# Patient Record
Sex: Female | Born: 1980 | Race: White | Hispanic: No | Marital: Married | Smoking: Never smoker
Health system: Southern US, Community
[De-identification: ages and names within clinical notes are randomized; demographics above are authoritative.]

## PROBLEM LIST (undated history)

## (undated) DIAGNOSIS — F419 Anxiety disorder, unspecified: Secondary | ICD-10-CM

## (undated) DIAGNOSIS — R519 Headache, unspecified: Secondary | ICD-10-CM

## (undated) DIAGNOSIS — D649 Anemia, unspecified: Secondary | ICD-10-CM

## (undated) DIAGNOSIS — T7840XA Allergy, unspecified, initial encounter: Secondary | ICD-10-CM

## (undated) DIAGNOSIS — R51 Headache: Secondary | ICD-10-CM

## (undated) DIAGNOSIS — G43909 Migraine, unspecified, not intractable, without status migrainosus: Secondary | ICD-10-CM

## (undated) HISTORY — DX: Migraine, unspecified, not intractable, without status migrainosus: G43.909

## (undated) HISTORY — DX: Headache, unspecified: R51.9

## (undated) HISTORY — DX: Allergy, unspecified, initial encounter: T78.40XA

## (undated) HISTORY — DX: Headache: R51

## (undated) HISTORY — DX: Anxiety disorder, unspecified: F41.9

## (undated) HISTORY — DX: Anemia, unspecified: D64.9

---

## 2014-12-09 ENCOUNTER — Encounter (INDEPENDENT_AMBULATORY_CARE_PROVIDER_SITE_OTHER): Payer: Self-pay

## 2014-12-09 ENCOUNTER — Encounter: Payer: Self-pay | Admitting: Nurse Practitioner

## 2014-12-09 ENCOUNTER — Ambulatory Visit (INDEPENDENT_AMBULATORY_CARE_PROVIDER_SITE_OTHER): Payer: 59 | Admitting: Nurse Practitioner

## 2014-12-09 VITALS — BP 138/82 | HR 96 | Temp 98.5°F | Resp 16 | Ht 65.0 in | Wt 196.2 lb

## 2014-12-09 DIAGNOSIS — Z7189 Other specified counseling: Secondary | ICD-10-CM | POA: Diagnosis not present

## 2014-12-09 DIAGNOSIS — R1013 Epigastric pain: Secondary | ICD-10-CM

## 2014-12-09 DIAGNOSIS — Z7689 Persons encountering health services in other specified circumstances: Secondary | ICD-10-CM

## 2014-12-09 NOTE — Patient Instructions (Signed)
Indigestion °Indigestion is discomfort in the upper abdomen that is caused by underlying problems such as gastroesophageal reflux disease (GERD), ulcers, or gallbladder problems.  °CAUSES  °Indigestion can be caused by many things. Possible causes include: °· Stomach acid in the esophagus. °· Stomach infections, usually caused by the bacteria H. pylori. °· Being overweight. °· Hiatal hernia. This means part of the stomach pushes up through the diaphragm. °· Overeating. °· Emotional problems, such as stress, anxiety, or depression. °· Poor nutrition. °· Consuming too much alcohol, tobacco, or caffeine. °· Consuming spicy foods, fats, peppermint, chocolate, tomato products, citrus, or fruit juices. °· Medicines such as aspirin and other anti-inflammatory drugs, hormones, steroids, and thyroid medicines. °· Gastroparesis. This is a condition in which the stomach does not empty properly. °· Stomach cancer. °· Pregnancy, due to an increase in hormone levels, a relaxation of muscles in the digestive tract, and pressure on the stomach from the growing fetus. °SYMPTOMS  °· Uncomfortable feeling of fullness after eating. °· Pain or burning sensation in the upper abdomen. °· Bloating. °· Belching and gas. °· Nausea and vomiting. °· Acidic taste in the mouth. °· Burning sensation in the chest (heartburn). °DIAGNOSIS  °Your caregiver will review your medical history and perform a physical exam. Other tests, such as blood tests, stool tests, X-rays, and other imaging scans, may be done to check for more serious problems. °TREATMENT  °Liquid antacids and other drugs may be given to block stomach acid secretion. Medicines that increase esophageal muscle tone may also be given to help reduce symptoms. If an infection is found, antibiotic medicine may be given. °HOME CARE INSTRUCTIONS °· Avoid foods and drinks that make your symptoms worse, such as: °¨ Caffeine or alcoholic drinks. °¨ Chocolate. °¨ Peppermint or mint  flavorings. °¨ Garlic and onions. °¨ Spicy foods. °¨ Citrus fruits, such as oranges, lemons, or limes. °¨ Tomato-based foods such as sauce, chili, salsa, and pizza. °¨ Fried and fatty foods. °· Avoid eating for the 3 hours prior to your bedtime. °· Eat small, frequent meals instead of large meals. °· Stop smoking if you smoke. °· Maintain a healthy weight. °· Wear loose-fitting clothing. Do not wear anything tight around your waist that causes pressure on your stomach. °· Raise the head of your bed 4 to 8 inches with wood blocks to help you sleep. Extra pillows will not help. °· Only take over-the-counter or prescription medicines as directed by your caregiver. °· Do not take aspirin, ibuprofen, or other nonsteroidal anti-inflammatory drugs (NSAIDs). °SEEK IMMEDIATE MEDICAL CARE IF:  °· You are not better after 2 days. °· You have chest pressure or pain that radiates up into your neck, arms, back, jaw, or upper abdomen. °· You have difficulty swallowing. °· You keep vomiting. °· You have black or bloody stools. °· You have a fever. °· You have dizziness, fainting, difficulty breathing, or heavy sweating. °· You have severe abdominal pain. °· You lose weight without trying. °MAKE SURE YOU: °· Understand these instructions. °· Will watch your condition. °· Will get help right away if you are not doing well or get worse. °Document Released: 06/14/2004 Document Revised: 07/30/2011 Document Reviewed: 12/20/2010 °[employer]® Patient Information ©2015 [employer]. This information is not intended to replace advice given to you by your health care provider. Make sure you discuss any questions you have with your health care provider. ° °

## 2014-12-09 NOTE — Progress Notes (Signed)
Subjective:    Patient ID: Patricia Baird, female    DOB: Dec 12, 1980, 34 y.o.   MRN: [id number]  HPI  Patricia Baird is a 34 yo female establishing care and a CC of discomfort of chest x 1 month.   1) New pt info:   Immunizations- Unknown   Pap- 2013 approx   Eye Exam- not UTD  LMP- 11/13/14 4 days, normal for pt   2) Chronic Problems-  Headaches- right side neck pain taking 1 ibuprofen at night, started taking ASA, switched tylenol for a few days. Has not been as bad.   3) Acute Problems- Headache, upset stomach, chest discomfort in the mornings, nausea and diarrhea one day  Took antacids OTC last night and today.   120's/70's at home    Takes ibuprofen nightly  Diet-  Eats fruits, veggies, some processed foods Exercise- 5-6 days a week, work out videos   Family hx of diabetes   Review of Systems  Constitutional: Negative for fever, chills, diaphoresis and fatigue.  Respiratory: Negative for chest tightness, shortness of breath and wheezing.   Cardiovascular: Negative for chest pain, palpitations and leg swelling.  Gastrointestinal: Negative for nausea, vomiting, diarrhea and rectal pain.  Skin: Negative for rash.  Neurological: Negative for dizziness, weakness, numbness and headaches.  Psychiatric/Behavioral: The patient is not nervous/anxious.    Past Medical History  Diagnosis Date  . Frequent headaches   . Allergy     Allergies  . Migraines     History   Social History  . Marital Status: Married    Spouse Name: N/A  . Number of Children: N/A  . Years of Education: N/A   Occupational History  . Not on file.   Social History Main Topics  . Smoking status: Never Smoker   . Smokeless tobacco: Never Used  . Alcohol Use: No  . Drug Use: No  . Sexual Activity:    Partners: Male     Comment: Husband   Other Topics Concern  . 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  Family History  Problem Relation Age of Onset  . Arthritis Mother   . Hyperlipidemia Mother   . Hypertension Mother   . Hyperlipidemia Father   . Hypertension Father   . Alcohol abuse Brother   . Arthritis Maternal Grandmother   . Cancer Maternal Grandmother     Not sure what kind thinks it was ovarian  . Hyperlipidemia Maternal Grandmother     No Known Allergies  No current outpatient prescriptions on file prior to visit.   No current facility-administered medications on file prior to visit.      Objective:   Physical Exam  Constitutional: She is oriented to person, place, and time. She appears well-developed and well-nourished. No distress.  BP 142/92 mmHg  Pulse 103  Temp(Src) 98.5 F (36.9 C)  Resp 16  Ht  (1.651 m)  Wt 196 lb 3.2 oz (88.996 kg)  BMI 32.65 kg/m2  SpO2 98%  Repeat 138/82 Pulse 96   HENT:  Head: Normocephalic and atraumatic.  Right Ear: External ear normal.  Left Ear: External ear normal.  Cardiovascular: Normal rate, regular rhythm,  normal heart sounds and intact distal pulses.  Exam reveals no gallop and no friction rub.   No murmur heard. Pulmonary/Chest: Effort normal and breath sounds normal. No respiratory distress. She has no wheezes. She has no rales. She exhibits no tenderness.  Neurological: She is alert and oriented to person, place, and time. No cranial nerve deficit. She exhibits normal muscle tone. Coordination normal.  Skin: Skin is warm and dry. No rash noted. She is not diaphoretic.  Psychiatric: She has a normal mood and affect. Her behavior is normal. Judgment and thought content normal.      Assessment & Plan:

## 2014-12-09 NOTE — Assessment & Plan Note (Signed)
Discussed acute and chronic issues. Reviewed health maintenance measures, PFSHx, and immunizations. Obtain records from previous facility.   

## 2014-12-09 NOTE — Assessment & Plan Note (Addendum)
Pt is improving. She researched her symptoms and figured it was due to her food and chronic ibuprofen use. Pt now doing small meals, occasional tylenol use, and started a OTC acid reducer. Will obtain CBC w/ diff. FU prn worsening/failure to improve.

## 2014-12-09 NOTE — Progress Notes (Signed)
Pre visit review using our clinic review tool, if applicable. No additional management support is needed unless otherwise documented below in the visit note. 

## 2014-12-10 LAB — CBC WITH DIFFERENTIAL/PLATELET
BASOS ABS: 0.1 10*3/uL (ref 0.0–0.1)
Basophils Relative: 0.7 % (ref 0.0–3.0)
Eosinophils Absolute: 0.1 10*3/uL (ref 0.0–0.7)
Eosinophils Relative: 0.9 % (ref 0.0–5.0)
HCT: 40.7 % (ref 36.0–46.0)
HEMOGLOBIN: 13.8 g/dL (ref 12.0–15.0)
LYMPHS ABS: 2.1 10*3/uL (ref 0.7–4.0)
Lymphocytes Relative: 25.7 % (ref 12.0–46.0)
MCHC: 34 g/dL (ref 30.0–36.0)
MCV: 81.3 fl (ref 78.0–100.0)
MONO ABS: 0.6 10*3/uL (ref 0.1–1.0)
Monocytes Relative: 7.6 % (ref 3.0–12.0)
Neutro Abs: 5.2 10*3/uL (ref 1.4–7.7)
Neutrophils Relative %: 65.1 % (ref 43.0–77.0)
Platelets: 287 10*3/uL (ref 150.0–400.0)
RBC: 5.01 Mil/uL (ref 3.87–5.11)
RDW: 13.7 % (ref 11.5–15.5)
WBC: 8.1 10*3/uL (ref 4.0–10.5)

## 2015-06-16 ENCOUNTER — Ambulatory Visit (INDEPENDENT_AMBULATORY_CARE_PROVIDER_SITE_OTHER): Payer: Managed Care, Other (non HMO) | Admitting: Family Medicine

## 2015-06-16 ENCOUNTER — Encounter: Payer: Self-pay | Admitting: Family Medicine

## 2015-06-16 VITALS — BP 120/76 | HR 112 | Temp 98.3°F | Ht 63.0 in | Wt 202.0 lb

## 2015-06-16 DIAGNOSIS — J301 Allergic rhinitis due to pollen: Secondary | ICD-10-CM

## 2015-06-16 DIAGNOSIS — J309 Allergic rhinitis, unspecified: Secondary | ICD-10-CM | POA: Insufficient documentation

## 2015-06-16 DIAGNOSIS — Z8669 Personal history of other diseases of the nervous system and sense organs: Secondary | ICD-10-CM

## 2015-06-16 DIAGNOSIS — R079 Chest pain, unspecified: Secondary | ICD-10-CM | POA: Insufficient documentation

## 2015-06-16 DIAGNOSIS — Z13220 Encounter for screening for lipoid disorders: Secondary | ICD-10-CM | POA: Diagnosis not present

## 2015-06-16 DIAGNOSIS — R0789 Other chest pain: Secondary | ICD-10-CM | POA: Insufficient documentation

## 2015-06-16 DIAGNOSIS — E669 Obesity, unspecified: Secondary | ICD-10-CM

## 2015-06-16 LAB — COMPREHENSIVE METABOLIC PANEL
ALT: 14 U/L (ref 0–35)
AST: 17 U/L (ref 0–37)
Albumin: 4.2 g/dL (ref 3.5–5.2)
Alkaline Phosphatase: 51 U/L (ref 39–117)
BUN: 10 mg/dL (ref 6–23)
CHLORIDE: 103 meq/L (ref 96–112)
CO2: 28 meq/L (ref 19–32)
Calcium: 9.4 mg/dL (ref 8.4–10.5)
Creatinine, Ser: 0.94 mg/dL (ref 0.40–1.20)
GFR: 71.63 mL/min (ref >60.00)
Glucose, Bld: 99 mg/dL (ref 70–99)
POTASSIUM: 4.2 meq/L (ref 3.5–5.1)
Sodium: 139 mEq/L (ref 135–145)
Total Bilirubin: 0.5 mg/dL (ref 0.2–1.2)
Total Protein: 7 g/dL (ref 6.0–8.3)

## 2015-06-16 LAB — LIPID PANEL
CHOL/HDL RATIO: 5
Cholesterol: 218 mg/dL — ABNORMAL HIGH (ref 0–200)
HDL: 47.2 mg/dL (ref >39.00)
LDL CALC: 137 mg/dL — AB (ref 0–99)
NonHDL: 170.53
TRIGLYCERIDES: 169 mg/dL — AB (ref 0.0–149.0)
VLDL: 33.8 mg/dL (ref 0.0–40.0)

## 2015-06-16 NOTE — Progress Notes (Signed)
Pre visit review using our clinic review tool, if applicable. No additional management support is needed unless otherwise documented below in the visit note. 

## 2015-06-16 NOTE — Progress Notes (Signed)
   Subjective:    Patient ID: Patricia Baird, female    DOB: 03/06/1981, 35 y.o.   MRN: [id number]  HPI  35 year old female presenting to establish care with me. It appears she established with Naomie Dean NP in 11/2014. She just wants to set up with an MD.  Allergies well controlled with zyrtec.  Immunizations- Unknown  Pap- 2013 approx , never had abnormal. Eye Exam- not UTD  Has not had labs to screen for chol and DM in many years.  Occ using peppermint oil,or tylenol for neck pain. Ongoing for years, but much better now. Used to be associated with migraines.  Rarely with epigastric pain, chest pain intermittant, about once a week. No association with eating or exercise. Had eval at visit last year , neg. Occ using tums for reflux. No SOB, dizziness associated. Improved with using ibuprofen less.  She exercises daily. Healthy diet. Body mass index is 35.79 kg/(m^2).   Social History /Family History/Past Medical History reviewed and updated if needed.   Review of Systems  Constitutional: Negative for fever and fatigue.  HENT: Negative for congestion and ear pain.   Eyes: Negative for pain.  Respiratory: Negative for cough and shortness of breath.   Cardiovascular: Negative for chest pain, palpitations and leg swelling.  Gastrointestinal: Negative for abdominal pain.  Genitourinary: Negative for dysuria and vaginal bleeding.  Musculoskeletal: Positive for back pain.  Neurological: Negative for syncope, light-headedness and headaches.  Psychiatric/Behavioral: Negative for dysphoric mood.       Objective:   Physical Exam  Constitutional: Vital signs are normal. She appears well-developed and well-nourished. She is cooperative.  Non-toxic appearance. She does not appear ill. No distress.  overweight  HENT:  Head: Normocephalic.  Right Ear: Hearing, tympanic membrane, external ear and ear canal normal.  Left Ear: Hearing, tympanic membrane, external ear and ear  canal normal.  Nose: Nose normal.  Eyes: Conjunctivae, EOM and lids are normal. Pupils are equal, round, and reactive to light. Lids are everted and swept, no foreign bodies found.  Neck: Trachea normal and normal range of motion. Neck supple. Carotid bruit is not present. No thyroid mass and no thyromegaly present.  Cardiovascular: Normal rate, regular rhythm, S1 normal, S2 normal, normal heart sounds and intact distal pulses.  Exam reveals no gallop.   No murmur heard. Pulmonary/Chest: Effort normal and breath sounds normal. No respiratory distress. She has no wheezes. She has no rhonchi. She has no rales.  Abdominal: Soft. Normal appearance and bowel sounds are normal. She exhibits no distension, no fluid wave, no abdominal bruit and no mass. There is no hepatosplenomegaly. There is no tenderness. There is no rebound, no guarding and no CVA tenderness. No hernia.  Lymphadenopathy:    She has no cervical adenopathy.    She has no axillary adenopathy.  Neurological: She is alert. She has normal strength. No cranial nerve deficit or sensory deficit.  Skin: Skin is warm, dry and intact. No rash noted.  Psychiatric: Her speech is normal and behavior is normal. Judgment normal. Her mood appears not anxious. Cognition and memory are normal. She does not exhibit a depressed mood.          Assessment & Plan:

## 2015-06-16 NOTE — Addendum Note (Signed)
Addended by: Baldomero Lamy on: 06/16/2015 09:27 AM   Modules accepted: Orders

## 2015-06-16 NOTE — Assessment & Plan Note (Signed)
Stable control on zyrtec as needed.

## 2015-06-16 NOTE — Patient Instructions (Addendum)
Schedule CPX  on way out in next few months. No labs prior, doing today.  Stop at lab on way out. Likely chest pain secondary to GI track. Avoid ibuprofen, use tylenol instead.  Consider  Trial of prilosec for reflux, avoid triggers.

## 2015-06-16 NOTE — Assessment & Plan Note (Signed)
Likely secondary to GI track. Avoid ibuprofen, use tylenol instead.  Consider  Trial of prilosec for reflux, avoid triggers

## 2015-06-17 ENCOUNTER — Encounter: Payer: Self-pay | Admitting: *Deleted

## 2015-08-19 ENCOUNTER — Other Ambulatory Visit (HOSPITAL_COMMUNITY)
Admission: RE | Admit: 2015-08-19 | Discharge: 2015-08-19 | Disposition: A | Discharge status: Home / Self Care | Payer: Managed Care, Other (non HMO) | Source: Ambulatory Visit | Attending: Family Medicine | Admitting: Family Medicine

## 2015-08-19 ENCOUNTER — Ambulatory Visit (INDEPENDENT_AMBULATORY_CARE_PROVIDER_SITE_OTHER): Payer: Managed Care, Other (non HMO) | Admitting: Family Medicine

## 2015-08-19 ENCOUNTER — Encounter: Payer: Self-pay | Admitting: Family Medicine

## 2015-08-19 VITALS — BP 124/70 | HR 88 | Temp 98.8°F | Ht 63.0 in | Wt 202.8 lb

## 2015-08-19 DIAGNOSIS — Z124 Encounter for screening for malignant neoplasm of cervix: Secondary | ICD-10-CM | POA: Diagnosis not present

## 2015-08-19 DIAGNOSIS — E669 Obesity, unspecified: Secondary | ICD-10-CM | POA: Diagnosis not present

## 2015-08-19 DIAGNOSIS — Z01419 Encounter for gynecological examination (general) (routine) without abnormal findings: Secondary | ICD-10-CM | POA: Insufficient documentation

## 2015-08-19 DIAGNOSIS — Z1151 Encounter for screening for human papillomavirus (HPV): Secondary | ICD-10-CM | POA: Insufficient documentation

## 2015-08-19 DIAGNOSIS — Z0000 Encounter for general adult medical examination without abnormal findings: Secondary | ICD-10-CM | POA: Diagnosis not present

## 2015-08-19 NOTE — Addendum Note (Signed)
Addended by: Damita LackLORING, Kataleyah Carducci S on: 08/19/2015 08:56 AM   Modules accepted: Orders

## 2015-08-19 NOTE — Patient Instructions (Signed)
Keep up great work on exercise and continue to work on low carb low fat diet.

## 2015-08-19 NOTE — Progress Notes (Signed)
Subjective:    Patient ID: Patricia Baird, female    DOB: 07-06-1980, 35 y.o.   MRN: [id number]  HPI  The patient is here for annual wellness exam and preventative care.    She is doing well overall. No concerns.  Earlier in year she has CMET and Lipids... nml except slightly elevated triglycerides.  Exercise: hour of exercise a day, work out videos and walking Diet: salads, but still sugar and fast food, working on decreasing.  Wt Readings from Last 3 Encounters:  08/19/15 202 lb 12 oz (91.967 kg)  06/16/15 202 lb (91.627 kg)  12/09/14 196 lb 3.2 oz (88.996 kg)   Body mass index is 35.92 kg/(m^2).  Social History /Family History/Past Medical History reviewed and updated if needed.  Review of Systems  Constitutional: Negative for fever and fatigue.  HENT: Negative for congestion.   Eyes: Negative for pain.  Respiratory: Negative for cough and shortness of breath.   Cardiovascular: Negative for chest pain, palpitations and leg swelling.  Gastrointestinal: Negative for abdominal pain.  Genitourinary: Negative for dysuria and vaginal bleeding.  Musculoskeletal: Negative for back pain.  Neurological: Negative for syncope, light-headedness and headaches.  Psychiatric/Behavioral: Negative for dysphoric mood.       Objective:   Physical Exam  Constitutional: Vital signs are normal. She appears well-developed and well-nourished. She is cooperative.  Non-toxic appearance. She does not appear ill. No distress.  HENT:  Head: Normocephalic.  Right Ear: Hearing, tympanic membrane, external ear and ear canal normal.  Left Ear: Hearing, tympanic membrane, external ear and ear canal normal.  Nose: Nose normal.  Eyes: Conjunctivae, EOM and lids are normal. Pupils are equal, round, and reactive to light. Lids are everted and swept, no foreign bodies found.  Neck: Trachea normal and normal range of motion. Neck supple. Carotid bruit is not present. No thyroid mass and no thyromegaly  present.  Cardiovascular: Normal rate, regular rhythm, S1 normal, S2 normal, normal heart sounds and intact distal pulses.  Exam reveals no gallop.   No murmur heard. Pulmonary/Chest: Effort normal and breath sounds normal. No respiratory distress. She has no wheezes. She has no rhonchi. She has no rales.  Abdominal: Soft. Normal appearance and bowel sounds are normal. She exhibits no distension, no fluid wave, no abdominal bruit and no mass. There is no hepatosplenomegaly. There is no tenderness. There is no rebound, no guarding and no CVA tenderness. No hernia.  Genitourinary: Vagina normal and uterus normal. No breast swelling, tenderness, discharge or bleeding. Pelvic exam was performed with patient supine. There is no rash, tenderness or lesion on the right labia. There is no rash, tenderness or lesion on the left labia. Uterus is not enlarged and not tender. Cervix exhibits no motion tenderness, no discharge and no friability. Right adnexum displays no mass, no tenderness and no fullness. Left adnexum displays no mass, no tenderness and no fullness.  Lymphadenopathy:    She has no cervical adenopathy.    She has no axillary adenopathy.  Neurological: She is alert. She has normal strength. No cranial nerve deficit or sensory deficit.  Skin: Skin is warm, dry and intact. No rash noted.  Psychiatric: Her speech is normal and behavior is normal. Judgment normal. Her mood appears not anxious. Cognition and memory are normal. She does not exhibit a depressed mood.          Assessment & Plan:  The patient's preventative maintenance and recommended screening tests for an annual wellness exam were reviewed in full  today. Brought up to date unless services declined.  Counselled on the importance of diet, exercise, and its role in overall health and mortality. The patient's FH and SH was reviewed, including their home life, tobacco status, and drug and alcohol status.   Nonsmoker Vaccines: Tdap  uptodate, believes she had it 6-8 years ago.. wil plan to repeat in 2019  PAP/DVE: Due  No early family history of breast cancer or colon cancer  [id number].

## 2015-08-19 NOTE — Progress Notes (Signed)
Pre visit review using our clinic review tool, if applicable. No additional management support is needed unless otherwise documented below in the visit note. 

## 2015-08-23 ENCOUNTER — Encounter: Payer: Self-pay | Admitting: *Deleted

## 2015-08-23 LAB — CYTOLOGY - PAP

## 2016-08-24 ENCOUNTER — Encounter: Payer: Managed Care, Other (non HMO) | Admitting: Family Medicine

## 2017-03-12 ENCOUNTER — Ambulatory Visit (INDEPENDENT_AMBULATORY_CARE_PROVIDER_SITE_OTHER): Payer: Managed Care, Other (non HMO) | Admitting: Family Medicine

## 2017-03-12 ENCOUNTER — Encounter: Payer: Self-pay | Admitting: Family Medicine

## 2017-03-12 VITALS — BP 130/80 | HR 93 | Temp 98.5°F | Ht 63.0 in | Wt 205.0 lb

## 2017-03-12 DIAGNOSIS — Z0000 Encounter for general adult medical examination without abnormal findings: Secondary | ICD-10-CM | POA: Diagnosis not present

## 2017-03-12 DIAGNOSIS — E669 Obesity, unspecified: Secondary | ICD-10-CM

## 2017-03-12 DIAGNOSIS — Z13220 Encounter for screening for lipoid disorders: Secondary | ICD-10-CM

## 2017-03-12 DIAGNOSIS — Z23 Encounter for immunization: Secondary | ICD-10-CM | POA: Diagnosis not present

## 2017-03-12 NOTE — Addendum Note (Signed)
Addended by: Damita LackLORING, Nakeysha Pasqual S on: 03/12/2017 04:42 PM   Modules accepted: Orders

## 2017-03-12 NOTE — Patient Instructions (Addendum)
Please stop at the lab to have labs drawn. Start low back stretches and core strengthening.

## 2017-03-12 NOTE — Assessment & Plan Note (Signed)
Encouraged exercise, weight loss, healthy eating habits. ? ?

## 2017-03-12 NOTE — Progress Notes (Signed)
Subjective:    Patient ID: Patricia Baird, female    DOB: 1981/02/07, 36 y.o.   MRN: [id number]  HPI   The patient is here for annual wellness exam and preventative care.      She is feeling well overall.   Exercise: 5 times a week.with walking or work out videos.  Diet: moderate, still eats a lot of sugar.    Social History /Family History/Past Medical History reviewed in detail and updated in EMR if needed. Blood pressure 130/80, pulse 93, temperature 98.5 F (36.9 C), temperature source Oral, height 5\' 3"  (1.6 m), weight 205 lb (93 kg), last menstrual period 03/05/2017.   Review of Systems  Constitutional: Negative for fatigue and fever.  HENT: Negative for congestion.   Eyes: Positive for pain.  Respiratory: Negative for cough and shortness of breath.   Cardiovascular: Negative for chest pain, palpitations and leg swelling.  Gastrointestinal: Negative for abdominal pain.  Genitourinary: Negative for dysuria and vaginal bleeding.  Musculoskeletal: Positive for back pain.  Neurological: Negative for syncope, light-headedness and headaches.  Psychiatric/Behavioral: Negative for dysphoric mood.       Objective:   Physical Exam  Constitutional: Vital signs are normal. She appears well-developed and well-nourished. She is cooperative.  Non-toxic appearance. She does not appear ill. No distress.  Central obesity  HENT:  Head: Normocephalic.  Right Ear: Hearing, tympanic membrane, external ear and ear canal normal.  Left Ear: Hearing, tympanic membrane, external ear and ear canal normal.  Nose: Nose normal.  Eyes: Pupils are equal, round, and reactive to light. Conjunctivae, EOM and lids are normal. Lids are everted and swept, no foreign bodies found.  Neck: Trachea normal and normal range of motion. Neck supple. Carotid bruit is not present. No thyroid mass and no thyromegaly present.  Cardiovascular: Normal rate, regular rhythm, S1 normal, S2 normal, normal heart  sounds and intact distal pulses.  Exam reveals no gallop.   No murmur heard. Pulmonary/Chest: Effort normal and breath sounds normal. No respiratory distress. She has no wheezes. She has no rhonchi. She has no rales.  Abdominal: Soft. Normal appearance and bowel sounds are normal. She exhibits no distension, no fluid wave, no abdominal bruit and no mass. There is no hepatosplenomegaly. There is no tenderness. There is no rebound, no guarding and no CVA tenderness. No hernia.  Musculoskeletal:       Lumbar back: Normal. She exhibits normal range of motion and no tenderness.  Lymphadenopathy:    She has no cervical adenopathy.    She has no axillary adenopathy.  Neurological: She is alert. She has normal strength. No cranial nerve deficit or sensory deficit.  Skin: Skin is warm, dry and intact. No rash noted.  Psychiatric: Her speech is normal and behavior is normal. Judgment normal. Her mood appears not anxious. Cognition and memory are normal. She does not exhibit a depressed mood.          Assessment & Plan:  The patient's preventative maintenance and recommended screening tests for an annual wellness exam were reviewed in full today. Brought up to date unless services declined.  Counselled on the importance of diet, exercise, and its role in overall health and mortality. The patient's FH and SH was reviewed, including their home life, tobacco status, and drug and alcohol status.   Vaccines: Due for flu and tdap Pap/DVE: 07/2015 nml pap, neg HPV.Marland Kitchen Repeat in 5 years. MGM ovarian cancer age 1, no clear indication for DVE. No early family history of  breast cancer or colon cancer. Plans mammograms age 9240s. Smoking Status: none ETOH/ drug use: none/none HIV screen:   refused

## 2017-03-13 LAB — COMPREHENSIVE METABOLIC PANEL
ALT: 13 U/L (ref 0–35)
AST: 18 U/L (ref 0–37)
Albumin: 4.3 g/dL (ref 3.5–5.2)
Alkaline Phosphatase: 50 U/L (ref 39–117)
BUN: 12 mg/dL (ref 6–23)
CHLORIDE: 103 meq/L (ref 96–112)
CO2: 28 meq/L (ref 19–32)
CREATININE: 0.94 mg/dL (ref 0.40–1.20)
Calcium: 9.8 mg/dL (ref 8.4–10.5)
GFR: 70.95 mL/min (ref >60.00)
Glucose, Bld: 108 mg/dL — ABNORMAL HIGH (ref 70–99)
Potassium: 4.7 mEq/L (ref 3.5–5.1)
Sodium: 141 mEq/L (ref 135–145)
Total Bilirubin: 0.3 mg/dL (ref 0.2–1.2)
Total Protein: 7.2 g/dL (ref 6.0–8.3)

## 2017-03-13 LAB — LIPID PANEL
CHOL/HDL RATIO: 6
Cholesterol: 232 mg/dL — ABNORMAL HIGH (ref 0–200)
HDL: 38.5 mg/dL — ABNORMAL LOW (ref >39.00)
NonHDL: 193.47
Triglycerides: 365 mg/dL — ABNORMAL HIGH (ref 0.0–149.0)
VLDL: 73 mg/dL — ABNORMAL HIGH (ref 0.0–40.0)

## 2017-03-13 LAB — LDL CHOLESTEROL, DIRECT: LDL DIRECT: 147 mg/dL

## 2018-01-21 ENCOUNTER — Encounter: Payer: Self-pay | Admitting: Family Medicine

## 2018-01-21 ENCOUNTER — Ambulatory Visit (INDEPENDENT_AMBULATORY_CARE_PROVIDER_SITE_OTHER): Payer: Managed Care, Other (non HMO) | Admitting: Family Medicine

## 2018-01-21 VITALS — BP 140/80 | HR 100 | Temp 99.1°F | Ht 63.0 in | Wt 204.2 lb

## 2018-01-21 DIAGNOSIS — R200 Anesthesia of skin: Secondary | ICD-10-CM | POA: Insufficient documentation

## 2018-01-21 LAB — COMPREHENSIVE METABOLIC PANEL
ALT: 14 U/L (ref 0–35)
AST: 15 U/L (ref 0–37)
Albumin: 4.7 g/dL (ref 3.5–5.2)
Alkaline Phosphatase: 54 U/L (ref 39–117)
BILIRUBIN TOTAL: 0.4 mg/dL (ref 0.2–1.2)
BUN: 10 mg/dL (ref 6–23)
CHLORIDE: 102 meq/L (ref 96–112)
CO2: 25 meq/L (ref 19–32)
CREATININE: 0.96 mg/dL (ref 0.40–1.20)
Calcium: 9.8 mg/dL (ref 8.4–10.5)
GFR: 68.93 mL/min (ref >60.00)
Glucose, Bld: 103 mg/dL — ABNORMAL HIGH (ref 70–99)
Potassium: 4 mEq/L (ref 3.5–5.1)
SODIUM: 138 meq/L (ref 135–145)
Total Protein: 7.7 g/dL (ref 6.0–8.3)

## 2018-01-21 LAB — TSH: TSH: 1.16 u[IU]/mL (ref 0.35–4.50)

## 2018-01-21 LAB — CBC WITH DIFFERENTIAL/PLATELET
BASOS ABS: 0.1 10*3/uL (ref 0.0–0.1)
BASOS PCT: 0.6 % (ref 0.0–3.0)
Eosinophils Absolute: 0 10*3/uL (ref 0.0–0.7)
Eosinophils Relative: 0.4 % (ref 0.0–5.0)
HEMATOCRIT: 36.6 % (ref 36.0–46.0)
Hemoglobin: 12 g/dL (ref 12.0–15.0)
Lymphocytes Relative: 21.5 % (ref 12.0–46.0)
Lymphs Abs: 1.8 10*3/uL (ref 0.7–4.0)
MCHC: 32.8 g/dL (ref 30.0–36.0)
MCV: 72.3 fl — ABNORMAL LOW (ref 78.0–100.0)
Monocytes Absolute: 0.6 10*3/uL (ref 0.1–1.0)
Monocytes Relative: 7.7 % (ref 3.0–12.0)
Neutro Abs: 5.9 10*3/uL (ref 1.4–7.7)
Neutrophils Relative %: 69.8 % (ref 43.0–77.0)
PLATELETS: 323 10*3/uL (ref 150.0–400.0)
RBC: 5.07 Mil/uL (ref 3.87–5.11)
RDW: 17.3 % — AB (ref 11.5–15.5)
WBC: 8.4 10*3/uL (ref 4.0–10.5)

## 2018-01-21 LAB — T4, FREE: Free T4: 1.07 ng/dL (ref 0.60–1.60)

## 2018-01-21 LAB — T3, FREE: T3 FREE: 3 pg/mL (ref 2.3–4.2)

## 2018-01-21 LAB — VITAMIN B12: Vitamin B-12: 237 pg/mL (ref 211–911)

## 2018-01-21 LAB — HEMOGLOBIN A1C: Hgb A1c MFr Bld: 6.2 % (ref 4.6–6.5)

## 2018-01-21 NOTE — Patient Instructions (Signed)
Please stop at the lab to have labs drawn.  Can try ibuprofen  800 mg at night and pillow support.

## 2018-01-21 NOTE — Progress Notes (Signed)
Subjective:    Patient ID: Patricia Baird, female    DOB: [birth year], 37 y.o.   MRN: [id number]  HPI   37 year old female reports tightness and numbness in left calf 5-6 days ago... Off and on returned that night.  Resolves entirely when up moving around. She sleeps in fetal position and had arm fall asleep.  Feels better sleeping on couch.  She has been unable to sleep continuously at night given awoken from sleep with tightness in leg.  She has been feeling more anxious lately. No swelling in ankles.Marland Kitchen occ does feels hands slightly swollen in AMs.   Yesterday had lightheadedness momentary. Thinks she was anxious... No SOB, no CP.  No new meds, no new meds   BP at home 117-130/70, blood sugars at home 90-110    Blood pressure 140/80, pulse 100, temperature 99.1 F (37.3 C), temperature source Oral, height 5\' 3"  (1.6 m), weight 204 lb 4 oz (92.6 kg), last menstrual period 01/06/2018.    Review of Systems  Constitutional: Positive for fatigue. Negative for fever.  HENT: Negative for ear pain.   Eyes: Negative for pain.  Respiratory: Negative for chest tightness and shortness of breath.   Cardiovascular: Negative for chest pain, palpitations and leg swelling.  Gastrointestinal: Negative for abdominal pain.  Genitourinary: Negative for dysuria.       Objective:   Physical Exam  Constitutional: She is oriented to person, place, and time. Vital signs are normal. She appears well-developed and well-nourished. She is cooperative.  Non-toxic appearance. She does not appear ill. No distress.  HENT:  Head: Normocephalic.  Right Ear: Hearing, tympanic membrane, external ear and ear canal normal. Tympanic membrane is not erythematous, not retracted and not bulging.  Left Ear: Hearing, tympanic membrane, external ear and ear canal normal. Tympanic membrane is not erythematous, not retracted and not bulging.  Nose: No mucosal edema or rhinorrhea. Right sinus exhibits no maxillary  sinus tenderness and no frontal sinus tenderness. Left sinus exhibits no maxillary sinus tenderness and no frontal sinus tenderness.  Mouth/Throat: Uvula is midline, oropharynx is clear and moist and mucous membranes are normal.  Eyes: Pupils are equal, round, and reactive to light. Conjunctivae, EOM and lids are normal. Lids are everted and swept, no foreign bodies found.  Neck: Trachea normal and normal range of motion. Neck supple. Carotid bruit is not present. No thyroid mass and no thyromegaly present.  Cardiovascular: Normal rate, regular rhythm, S1 normal, S2 normal, normal heart sounds, intact distal pulses and normal pulses. Exam reveals no gallop and no friction rub.  No murmur heard. Pulmonary/Chest: Effort normal and breath sounds normal. No tachypnea. No respiratory distress. She has no decreased breath sounds. She has no wheezes. She has no rhonchi. She has no rales.  Abdominal: Soft. Normal appearance and bowel sounds are normal. There is no tenderness.  Neurological: She is alert and oriented to person, place, and time. She has normal strength and normal reflexes. No cranial nerve deficit or sensory deficit. She exhibits normal muscle tone. She displays a negative Romberg sign. Coordination and gait normal. GCS eye subscore is 4. GCS verbal subscore is 5. GCS motor subscore is 6.  Nml cerebellar exam   No papilledema  Skin: Skin is warm, dry and intact. No rash noted.  Neg Homan's sign in bilateral leg.Marland Kitchen No visible swelling in legs.   Small varicose veins  Psychiatric: She has a normal mood and affect. Her speech is normal and behavior is normal. Judgment  and thought content normal. Her mood appears not anxious. Cognition and memory are normal. Cognition and memory are not impaired. She does not exhibit a depressed mood. She exhibits normal recent memory and normal remote memory.          Assessment & Plan:

## 2018-01-21 NOTE — Telephone Encounter (Signed)
I spoke with Patricia Baird; she is not having swelling, SOB, no redness or warmth to touch. Patricia Baird does have some pain which Patricia Baird describes as constant cramping feeling; pain level 3-5, tightness, pressure feeling and Patricia Baird is very anxious about what this could be. Patricia Baird scheduled 01/21/18 at 12:15; to arrive at 12 noon. FYI to Dr Ermalene Searing.

## 2018-01-21 NOTE — Assessment & Plan Note (Signed)
No clear clot in legs. May be due to or at least symptoms worsened by anxiety, ? Started as localized compression of peroneal nerve.  Will eval with labs.  No S/S of DVT , no red flags. Can try a course of NSAID for inflammation and leg pain.

## 2018-01-23 ENCOUNTER — Ambulatory Visit: Payer: Managed Care, Other (non HMO) | Admitting: Family Medicine

## 2018-01-23 ENCOUNTER — Encounter: Payer: Self-pay | Admitting: *Deleted

## 2018-04-19 DIAGNOSIS — L299 Pruritus, unspecified: Secondary | ICD-10-CM | POA: Insufficient documentation

## 2018-04-26 DIAGNOSIS — R42 Dizziness and giddiness: Secondary | ICD-10-CM | POA: Insufficient documentation

## 2018-05-05 ENCOUNTER — Telehealth: Payer: Self-pay | Admitting: Family Medicine

## 2018-05-05 DIAGNOSIS — E669 Obesity, unspecified: Secondary | ICD-10-CM

## 2018-05-05 NOTE — Telephone Encounter (Signed)
-----   Message from Wendi MayaLauren Greeson, RT sent at 04/29/2018  8:55 AM EST ----- Regarding: [facility] lab orders for 05/07/18. Thanks!

## 2018-05-07 ENCOUNTER — Other Ambulatory Visit (INDEPENDENT_AMBULATORY_CARE_PROVIDER_SITE_OTHER): Payer: Managed Care, Other (non HMO)

## 2018-05-07 DIAGNOSIS — E669 Obesity, unspecified: Secondary | ICD-10-CM

## 2018-05-07 LAB — LIPID PANEL
Cholesterol: 214 mg/dL — ABNORMAL HIGH (ref 0–200)
HDL: 39.6 mg/dL (ref >39.00)
LDL Cholesterol: 146 mg/dL — ABNORMAL HIGH (ref 0–99)
NonHDL: 174.46
Total CHOL/HDL Ratio: 5
Triglycerides: 143 mg/dL (ref 0.0–149.0)
VLDL: 28.6 mg/dL (ref 0.0–40.0)

## 2018-05-07 LAB — COMPREHENSIVE METABOLIC PANEL
ALT: 10 U/L (ref 0–35)
AST: 12 U/L (ref 0–37)
Albumin: 4.3 g/dL (ref 3.5–5.2)
Alkaline Phosphatase: 54 U/L (ref 39–117)
BUN: 15 mg/dL (ref 6–23)
CO2: 29 mEq/L (ref 19–32)
Calcium: 9.3 mg/dL (ref 8.4–10.5)
Chloride: 105 mEq/L (ref 96–112)
Creatinine, Ser: 0.97 mg/dL (ref 0.40–1.20)
GFR: 68 mL/min (ref >60.00)
Glucose, Bld: 107 mg/dL — ABNORMAL HIGH (ref 70–99)
Potassium: 4.6 mEq/L (ref 3.5–5.1)
Sodium: 140 mEq/L (ref 135–145)
TOTAL PROTEIN: 7.1 g/dL (ref 6.0–8.3)
Total Bilirubin: 0.5 mg/dL (ref 0.2–1.2)

## 2018-05-12 ENCOUNTER — Encounter: Payer: Self-pay | Admitting: Family Medicine

## 2018-05-12 ENCOUNTER — Ambulatory Visit (INDEPENDENT_AMBULATORY_CARE_PROVIDER_SITE_OTHER): Payer: Managed Care, Other (non HMO) | Admitting: Family Medicine

## 2018-05-12 VITALS — BP 118/78 | HR 86 | Temp 98.9°F | Ht 63.5 in | Wt 188.0 lb

## 2018-05-12 DIAGNOSIS — E669 Obesity, unspecified: Secondary | ICD-10-CM | POA: Insufficient documentation

## 2018-05-12 DIAGNOSIS — Z6832 Body mass index (BMI) 32.0-32.9, adult: Secondary | ICD-10-CM | POA: Diagnosis not present

## 2018-05-12 DIAGNOSIS — R7303 Prediabetes: Secondary | ICD-10-CM | POA: Diagnosis not present

## 2018-05-12 DIAGNOSIS — Z0000 Encounter for general adult medical examination without abnormal findings: Secondary | ICD-10-CM

## 2018-05-12 LAB — POCT GLYCOSYLATED HEMOGLOBIN (HGB A1C): Hemoglobin A1C: 5.7 % — AB (ref 4.0–5.6)

## 2018-05-12 NOTE — Progress Notes (Signed)
Subjective:    Patient ID: Patricia Baird, female    DOB: August 15, 1980, 37 y.o.   MRN: [id number]  HPI  The patient is here for annual wellness exam and preventative care.    Lab Results  Component Value Date   CHOL 214 (H) 05/07/2018   HDL 39.60 05/07/2018   LDLCALC 146 (H) 05/07/2018   LDLDIRECT 147.0 03/12/2017   TRIG 143.0 05/07/2018   CHOLHDL 5 05/07/2018    Prediabetes : She has been working on lower carb diet   Lab Results  Component Value Date   HGBA1C 6.2 01/21/2018     Diet: good Exercise:5 days a week   She has lost weight in last 3 months. Wt Readings from Last 3 Encounters:  05/12/18 188 lb (85.3 kg)  01/21/18 204 lb 4 oz (92.6 kg)  03/12/17 205 lb (93 kg)      numbness in legs resolved.  Social History /Family History/Past Medical History reviewed in detail and updated in EMR if needed. Blood pressure 118/78, pulse 86, temperature 98.9 F (37.2 C), temperature source Oral, height 5' 3.5" (1.613 m), weight 188 lb (85.3 kg), SpO2 99 %.  Review of Systems  Constitutional: Negative for fatigue and fever.  HENT: Negative for congestion.   Eyes: Negative for pain.  Respiratory: Negative for cough and shortness of breath.   Cardiovascular: Negative for chest pain, palpitations and leg swelling.  Gastrointestinal: Negative for abdominal pain.  Genitourinary: Negative for dysuria and vaginal bleeding.  Musculoskeletal: Negative for back pain.  Neurological: Negative for syncope, light-headedness and headaches.  Psychiatric/Behavioral: Negative for dysphoric mood.       Objective:   Physical Exam Constitutional:      General: She is not in acute distress.    Appearance: Normal appearance. She is well-developed. She is not ill-appearing or toxic-appearing.  HENT:     Head: Normocephalic.     Right Ear: Hearing, tympanic membrane, ear canal and external ear normal. Tympanic membrane is not erythematous, retracted or bulging.     Left Ear: Hearing,  tympanic membrane, ear canal and external ear normal. Tympanic membrane is not erythematous, retracted or bulging.     Nose: Nose normal. No mucosal edema or rhinorrhea.     Right Sinus: No maxillary sinus tenderness or frontal sinus tenderness.     Left Sinus: No maxillary sinus tenderness or frontal sinus tenderness.     Mouth/Throat:     Pharynx: Uvula midline.  Eyes:     General: Lids are normal. Lids are everted, no foreign bodies appreciated.     Conjunctiva/sclera: Conjunctivae normal.     Pupils: Pupils are equal, round, and reactive to light.  Neck:     Musculoskeletal: Normal range of motion and neck supple.     Thyroid: No thyroid mass or thyromegaly.     Vascular: No carotid bruit.     Trachea: Trachea normal.  Cardiovascular:     Rate and Rhythm: Normal rate and regular rhythm.     Pulses: Normal pulses.     Heart sounds: Normal heart sounds, S1 normal and S2 normal. No murmur. No friction rub. No gallop.   Pulmonary:     Effort: Pulmonary effort is normal. No tachypnea or respiratory distress.     Breath sounds: Normal breath sounds. No decreased breath sounds, wheezing, rhonchi or rales.  Abdominal:     General: Bowel sounds are normal. There is no distension or abdominal bruit.     Palpations: Abdomen is soft. There  is no fluid wave or mass.     Tenderness: There is no abdominal tenderness. There is no guarding or rebound.     Hernia: No hernia is present.  Lymphadenopathy:     Cervical: No cervical adenopathy.  Skin:    General: Skin is warm and dry.     Findings: No rash.  Neurological:     Mental Status: She is alert.     Cranial Nerves: No cranial nerve deficit.     Sensory: No sensory deficit.  Psychiatric:        Mood and Affect: Mood is not anxious or depressed.        Speech: Speech normal.        Behavior: Behavior normal. Behavior is cooperative.        Thought Content: Thought content normal.        Judgment: Judgment normal.        Assessment  & Plan:  The patient's preventative maintenance and recommended screening tests for an annual wellness exam were reviewed in full today. Brought up to date unless services declined.  Counselled on the importance of diet, exercise, and its role in overall health and mortality. The patient's FH and SH was reviewed, including their home life, tobacco status, and drug and alcohol status.   Vaccines: uptodate tdap,  Refuses flu vaccine Pap/DVE: 07/2015 nml pap, neg HPV.Marland Kitchen. Repeat in 5 years. MGM ovarian cancer age 37, no clear indication for DVE. No early family history of breast cancer or colon cancer. Plans mammograms age 940s. Smoking Status: none ETOH/ drug use: none/none HIV screen:   completed.

## 2018-05-12 NOTE — Assessment & Plan Note (Signed)
She has lost 20 lbs in last 3 months! Working on low carb diet.

## 2018-05-12 NOTE — Assessment & Plan Note (Signed)
Continue low carb diet and regular exercsie... continued weight loss is goal.

## 2019-05-07 ENCOUNTER — Telehealth: Payer: Self-pay

## 2019-05-07 NOTE — Telephone Encounter (Signed)
LVM w COVID screen, front door and back lab 12.18.2020 TLJ  

## 2019-05-08 ENCOUNTER — Other Ambulatory Visit (INDEPENDENT_AMBULATORY_CARE_PROVIDER_SITE_OTHER): Payer: Managed Care, Other (non HMO)

## 2019-05-08 ENCOUNTER — Other Ambulatory Visit: Payer: Self-pay

## 2019-05-08 ENCOUNTER — Telehealth: Payer: Self-pay | Admitting: Family Medicine

## 2019-05-08 DIAGNOSIS — R7303 Prediabetes: Secondary | ICD-10-CM | POA: Diagnosis not present

## 2019-05-08 DIAGNOSIS — E669 Obesity, unspecified: Secondary | ICD-10-CM

## 2019-05-08 DIAGNOSIS — Z6832 Body mass index (BMI) 32.0-32.9, adult: Secondary | ICD-10-CM | POA: Diagnosis not present

## 2019-05-08 LAB — COMPREHENSIVE METABOLIC PANEL
ALT: 8 U/L (ref 0–35)
AST: 13 U/L (ref 0–37)
Albumin: 4.4 g/dL (ref 3.5–5.2)
Alkaline Phosphatase: 45 U/L (ref 39–117)
BUN: 11 mg/dL (ref 6–23)
CO2: 29 mEq/L (ref 19–32)
Calcium: 9.3 mg/dL (ref 8.4–10.5)
Chloride: 105 mEq/L (ref 96–112)
Creatinine, Ser: 0.89 mg/dL (ref 0.40–1.20)
GFR: 70.3 mL/min (ref >60.00)
Glucose, Bld: 99 mg/dL (ref 70–99)
Potassium: 4.4 mEq/L (ref 3.5–5.1)
Sodium: 140 mEq/L (ref 135–145)
Total Bilirubin: 0.5 mg/dL (ref 0.2–1.2)
Total Protein: 7 g/dL (ref 6.0–8.3)

## 2019-05-08 LAB — LIPID PANEL
Cholesterol: 216 mg/dL — ABNORMAL HIGH (ref 0–200)
HDL: 44.7 mg/dL (ref >39.00)
LDL Cholesterol: 146 mg/dL — ABNORMAL HIGH (ref 0–99)
NonHDL: 171.26
Total CHOL/HDL Ratio: 5
Triglycerides: 126 mg/dL (ref 0.0–149.0)
VLDL: 25.2 mg/dL (ref 0.0–40.0)

## 2019-05-08 LAB — HEMOGLOBIN A1C: Hgb A1c MFr Bld: 5.4 % (ref 4.6–6.5)

## 2019-05-08 NOTE — Progress Notes (Signed)
No critical labs need to be addressed urgently. We will discuss labs in detail at upcoming office visit.   

## 2019-05-08 NOTE — Telephone Encounter (Signed)
-----   Message from Cloyd Stagers, RT sent at 05/07/2019  9:46 AM EST ----- Regarding: Lab Orders for Friday 12.18.2020 Please place lab orders for Friday 12.18.2020, office visit for physical on Tuesday 12.29.2020 Thank you, Dyke Maes RT(R)

## 2019-05-11 ENCOUNTER — Encounter: Payer: Self-pay | Admitting: *Deleted

## 2019-05-13 ENCOUNTER — Other Ambulatory Visit: Payer: Managed Care, Other (non HMO)

## 2019-05-19 ENCOUNTER — Other Ambulatory Visit: Payer: Self-pay

## 2019-05-19 ENCOUNTER — Ambulatory Visit (INDEPENDENT_AMBULATORY_CARE_PROVIDER_SITE_OTHER): Payer: Managed Care, Other (non HMO) | Admitting: Family Medicine

## 2019-05-19 ENCOUNTER — Encounter: Payer: Managed Care, Other (non HMO) | Admitting: Family Medicine

## 2019-05-19 ENCOUNTER — Encounter: Payer: Self-pay | Admitting: Family Medicine

## 2019-05-19 VITALS — BP 128/86 | HR 89 | Temp 99.7°F | Ht 63.0 in | Wt 196.8 lb

## 2019-05-19 DIAGNOSIS — Z0000 Encounter for general adult medical examination without abnormal findings: Secondary | ICD-10-CM | POA: Diagnosis not present

## 2019-05-19 DIAGNOSIS — Z1231 Encounter for screening mammogram for malignant neoplasm of breast: Secondary | ICD-10-CM | POA: Diagnosis not present

## 2019-05-19 NOTE — Patient Instructions (Addendum)
Call to schedule on your own.  Get back on track with lifestyle changes.    Preventive Care 31-39 Years Old, Female Preventive care refers to visits with your health care provider and lifestyle choices that can promote health and wellness. This includes:  A yearly physical exam. This may also be called an annual well check.  Regular dental visits and eye exams.  Immunizations.  Screening for certain conditions.  Healthy lifestyle choices, such as eating a healthy diet, getting regular exercise, not using drugs or products that contain nicotine and tobacco, and limiting alcohol use. What can I expect for my preventive care visit? Physical exam Your health care provider will check your:  Height and weight. This may be used to calculate body mass index (BMI), which tells if you are at a healthy weight.  Heart rate and blood pressure.  Skin for abnormal spots. Counseling Your health care provider may ask you questions about your:  Alcohol, tobacco, and drug use.  Emotional well-being.  Home and relationship well-being.  Sexual activity.  Eating habits.  Work and work Statistician.  Method of birth control.  Menstrual cycle.  Pregnancy history. What immunizations do I need?  Influenza (flu) vaccine  This is recommended every year. Tetanus, diphtheria, and pertussis (Tdap) vaccine  You may need a Td booster every 10 years. Varicella (chickenpox) vaccine  You may need this if you have not been vaccinated. Human papillomavirus (HPV) vaccine  If recommended by your health care provider, you may need three doses over 6 months. Measles, mumps, and rubella (MMR) vaccine  You may need at least one dose of MMR. You may also need a second dose. Meningococcal conjugate (MenACWY) vaccine  One dose is recommended if you are age 6-21 years and a first-year college student living in a residence hall, or if you have one of several medical conditions. You may also need  additional booster doses. Pneumococcal conjugate (PCV13) vaccine  You may need this if you have certain conditions and were not previously vaccinated. Pneumococcal polysaccharide (PPSV23) vaccine  You may need one or two doses if you smoke cigarettes or if you have certain conditions. Hepatitis A vaccine  You may need this if you have certain conditions or if you travel or work in places where you may be exposed to hepatitis A. Hepatitis B vaccine  You may need this if you have certain conditions or if you travel or work in places where you may be exposed to hepatitis B. Haemophilus influenzae type b (Hib) vaccine  You may need this if you have certain conditions. You may receive vaccines as individual doses or as more than one vaccine together in one shot (combination vaccines). Talk with your health care provider about the risks and benefits of combination vaccines. What tests do I need?  Blood tests  Lipid and cholesterol levels. These may be checked every 5 years starting at age 39.  Hepatitis C test.  Hepatitis B test. Screening  Diabetes screening. This is done by checking your blood sugar (glucose) after you have not eaten for a while (fasting).  Sexually transmitted disease (STD) testing.  BRCA-related cancer screening. This may be done if you have a family history of breast, ovarian, tubal, or peritoneal cancers.  Pelvic exam and Pap test. This may be done every 3 years starting at age 26. Starting at age 10, this may be done every 5 years if you have a Pap test in combination with an HPV test. Talk with your  health care provider about your test results, treatment options, and if necessary, the need for more tests. Follow these instructions at home: Eating and drinking   Eat a diet that includes fresh fruits and vegetables, whole grains, lean protein, and low-fat dairy.  Take vitamin and mineral supplements as recommended by your health care provider.  Do not  drink alcohol if: ? Your health care provider tells you not to drink. ? You are pregnant, may be pregnant, or are planning to become pregnant.  If you drink alcohol: ? Limit how much you have to 0-1 drink a day. ? Be aware of how much alcohol is in your drink. In the [place] one drink equals one 12 oz bottle of beer (355 mL), one 5 oz glass of wine (148 mL), or one 1 oz glass of hard liquor (44 mL). Lifestyle  Take daily care of your teeth and gums.  Stay active. Exercise for at least 30 minutes on 5 or more days each week.  Do not use any products that contain nicotine or tobacco, such as cigarettes, e-cigarettes, and chewing tobacco. If you need help quitting, ask your health care provider.  If you are sexually active, practice safe sex. Use a condom or other form of birth control (contraception) in order to prevent pregnancy and STIs (sexually transmitted infections). If you plan to become pregnant, see your health care provider for a preconception visit. What's next?  Visit your health care provider once a year for a well check visit.  Ask your health care provider how often you should have your eyes and teeth checked.  Stay up to date on all vaccines. This information is not intended to replace advice given to you by your health care provider. Make sure you discuss any questions you have with your health care provider. Document Released: 07/03/2001 Document Revised: 01/16/2018 Document Reviewed: 01/16/2018 Elsevier Patient Education  2020 [employer].

## 2019-05-19 NOTE — Progress Notes (Signed)
Chief Complaint  Patient presents with  . Annual Exam    History of Present Illness: HPI  The patient is here for annual wellness exam and preventative care.    Cholesterol slightly high  Exercise: 5 days a week walking  Diet: moderate Body mass index is 34.85 kg/m.    Obesity  Wt Readings from Last 3 Encounters:  05/19/19 196 lb 12 oz (89.2 kg)  05/12/18 188 lb (85.3 kg)  01/21/18 204 lb 4 oz (92.6 kg)  Body mass index is 34.85 kg/m.    prediabetes  Resolved. Lab Results  Component Value Date   HGBA1C 5.4 05/08/2019     This visit occurred during the SARS-CoV-2 public health emergency.  Safety protocols were in place, including screening questions prior to the visit, additional usage of staff PPE, and extensive cleaning of exam room while observing appropriate contact time as indicated for disinfecting solutions.   COVID 19 screen:  No recent travel or known exposure to COVID19 The patient denies respiratory symptoms of COVID 19 at this time. The importance of social distancing was discussed today.     Review of Systems  Constitutional: Negative for chills and fever.  HENT: Negative for congestion and ear pain.   Eyes: Negative for pain and redness.  Respiratory: Negative for cough and shortness of breath.   Cardiovascular: Negative for chest pain, palpitations and leg swelling.  Gastrointestinal: Negative for abdominal pain, blood in stool, constipation, diarrhea, nausea and vomiting.  Genitourinary: Negative for dysuria.  Musculoskeletal: Negative for falls and myalgias.  Skin: Negative for rash.  Neurological: Negative for dizziness.  Psychiatric/Behavioral: Negative for depression. The patient is not nervous/anxious.       Past Medical History:  Diagnosis Date  . Allergy    Allergies  . Frequent headaches   . Migraines     reports that she has never smoked. She has never used smokeless tobacco. She reports that she does not drink alcohol or use  drugs.   Current Outpatient Medications:  .  acetaminophen (TYLENOL) 500 MG tablet, Take 500 mg by mouth every 6 (six) hours as needed., Disp: , Rfl:  .  aspirin 325 MG tablet, , Disp: , Rfl:  .  cetirizine (ZYRTEC) 10 MG tablet, Take 10 mg by mouth daily. Reported on 06/16/2015, Disp: , Rfl:    Observations/Objective: Blood pressure 128/86, pulse 89, temperature 99.7 F (37.6 C), temperature source Temporal, height 5\' 3"  (1.6 m), weight 196 lb 12 oz (89.2 kg), last menstrual period 04/29/2019, SpO2 99 %.  Physical Exam Constitutional:      General: She is not in acute distress.    Appearance: Normal appearance. She is well-developed. She is not ill-appearing or toxic-appearing.  HENT:     Head: Normocephalic.     Right Ear: Hearing, tympanic membrane, ear canal and external ear normal.     Left Ear: Hearing, tympanic membrane, ear canal and external ear normal.     Nose: Nose normal.  Eyes:     General: Lids are normal. Lids are everted, no foreign bodies appreciated.     Conjunctiva/sclera: Conjunctivae normal.     Pupils: Pupils are equal, round, and reactive to light.  Neck:     Thyroid: No thyroid mass or thyromegaly.     Vascular: No carotid bruit.     Trachea: Trachea normal.  Cardiovascular:     Rate and Rhythm: Normal rate and regular rhythm.     Heart sounds: Normal heart sounds, S1 normal and  S2 normal. No murmur. No gallop.   Pulmonary:     Effort: Pulmonary effort is normal. No respiratory distress.     Breath sounds: Normal breath sounds. No wheezing, rhonchi or rales.  Abdominal:     General: Bowel sounds are normal. There is no distension or abdominal bruit.     Palpations: Abdomen is soft. There is no fluid wave or mass.     Tenderness: There is no abdominal tenderness. There is no guarding or rebound.     Hernia: No hernia is present.  Musculoskeletal:     Cervical back: Normal range of motion and neck supple.  Lymphadenopathy:     Cervical: No cervical  adenopathy.  Skin:    General: Skin is warm and dry.     Findings: No rash.  Neurological:     Mental Status: She is alert.     Cranial Nerves: No cranial nerve deficit.     Sensory: No sensory deficit.  Psychiatric:        Mood and Affect: Mood is not anxious or depressed.        Speech: Speech normal.        Behavior: Behavior normal. Behavior is cooperative.        Judgment: Judgment normal.      Assessment and Plan   The patient's preventative maintenance and recommended screening tests for an annual wellness exam were reviewed in full today. Brought up to date unless services declined.  Counselled on the importance of diet, exercise, and its role in overall health and mortality. The patient's FH and SH was reviewed, including their home life, tobacco status, and drug and alcohol status.   Vaccines: uptodate tdap,  Refuses flu vaccine Pap/DVE:07/2015 nml pap, neg HPV.Marland Kitchen Repeat in 5 years. MGM ovarian cancer age 76, no clear indication for DVE. No early family history of breast cancer or colon cancer. Plans mammograms age 63s... will be due  2021 Smoking Status:none ETOH/ drug QPY:PPJK/DTOI HIV screen: completed.  Eliezer Lofts, MD

## 2019-06-01 ENCOUNTER — Ambulatory Visit
Admission: RE | Admit: 2019-06-01 | Discharge: 2019-06-01 | Disposition: A | Discharge status: Home / Self Care | Payer: Managed Care, Other (non HMO) | Source: Ambulatory Visit | Attending: Family Medicine | Admitting: Family Medicine

## 2019-06-01 DIAGNOSIS — Z1231 Encounter for screening mammogram for malignant neoplasm of breast: Secondary | ICD-10-CM | POA: Insufficient documentation

## 2019-06-08 ENCOUNTER — Ambulatory Visit: Payer: Managed Care, Other (non HMO) | Attending: Internal Medicine

## 2019-06-08 DIAGNOSIS — Z20822 Contact with and (suspected) exposure to covid-19: Secondary | ICD-10-CM

## 2019-06-09 ENCOUNTER — Other Ambulatory Visit: Payer: Managed Care, Other (non HMO)

## 2019-06-10 LAB — NOVEL CORONAVIRUS, NAA: SARS-CoV-2, NAA: DETECTED — AB

## 2019-06-15 ENCOUNTER — Ambulatory Visit: Payer: Self-pay

## 2019-06-15 NOTE — Telephone Encounter (Signed)
Patricia Baird notified as instructed by telephone.  She was just wondering if there could be another type of infection going on that could be causing her fever other than Covid.  I offered to schedule her a virtual visit tomorrow with Dr. Ermalene Searing but she said she doesn't have very good insurance and would have to pay for visit.  She doesn't wish to schedule a visit at this time.

## 2019-06-15 NOTE — Telephone Encounter (Signed)
   Tylenol or ibuprofen  She is still infectious.  Keep isolating until she is as fully asymptomatic with no fever even within this timeframe  Cc: Dr. Leonard Schwartz

## 2019-06-15 NOTE — Telephone Encounter (Signed)
Pt is a [facility] pt. Routing to their triage nurse. See note in chart: Damita Lack, CMA to [place], [place]     06/15/19 12:52 PM Dr. Ermalene Searing is not in the office today.  Please call the office to speak with our triage nurse if this can't wait until tomorrow when Dr. Ermalene Searing is back in the office.  Thanks, Scot Jun wrote: Message from Valora Piccolo sent at 06/15/2019 2:16 PM EST  Patient is calling to ask some advice- she is 12 days positive with COVID she has fevers and chills. Patient would like to know what she can do to break the fever. Her husband is staying in another home and she would like to know when he can return home. Please advise? And what she can do to break the fever? Please advise

## 2019-06-16 ENCOUNTER — Other Ambulatory Visit: Payer: Managed Care, Other (non HMO)

## 2019-06-16 ENCOUNTER — Other Ambulatory Visit (INDEPENDENT_AMBULATORY_CARE_PROVIDER_SITE_OTHER): Payer: Managed Care, Other (non HMO)

## 2019-06-16 ENCOUNTER — Other Ambulatory Visit: Payer: Self-pay

## 2019-06-16 DIAGNOSIS — R350 Frequency of micturition: Secondary | ICD-10-CM

## 2019-06-16 LAB — POC URINALSYSI DIPSTICK (AUTOMATED)
Bilirubin, UA: NEGATIVE
Blood, UA: NEGATIVE
Glucose, UA: NEGATIVE
Ketones, UA: NEGATIVE
Leukocytes, UA: NEGATIVE
Nitrite, UA: NEGATIVE
Protein, UA: NEGATIVE
Spec Grav, UA: 1.01 (ref 1.010–1.025)
Urobilinogen, UA: 0.2 E.U./dL
pH, UA: 6.5 (ref 5.0–8.0)

## 2019-06-16 NOTE — Telephone Encounter (Signed)
Patricia Baird, this patient needs to drop off a urine sample for UA and possibly micro TODAY.. can you instruct her how to do it... make sure she is aware there is a charge.

## 2019-06-16 NOTE — Telephone Encounter (Signed)
Spoke with patient.  She will come by office at 2:00 and pick up urine cup and wipes.  She will have to go home to collect sample due to being Covid +.  She will return sample and leave outside on picnic table.

## 2019-06-19 NOTE — Telephone Encounter (Signed)
Pt left my chart note but also wanted to call since it is Fri afternoon. Pt said had positive covid about 16 days ago.pt is off quarantine and no covid symptoms except diarrhea. On and off pt has had rt lower side pain; now a pain level of 3. On 06/16/19 urine was OK; pt continues on and off with rt side pain and if presses on rt side pain is noticeable. Pt has had diarrhea on and off for few days, no mucus but this morning did see small amt of blood on tissue. Pt does not want to go to ED due to cost. [facility] advised [facility] if she came there they would send pt to ED. Pt does not feel she is to the point of needing ED. No available appts at [facility]; Dr Ermalene Searing would consider working pt in but would be at end of day and if testing or CT needed would not be able to do due to late Fri afternoon. Dr Ermalene Searing suggest [facility]. Pt voiced understanding and will call [facility] with some lower abd pain. Pt will cb if needed. FYI to Dr Ermalene Searing.

## 2019-06-23 ENCOUNTER — Ambulatory Visit (INDEPENDENT_AMBULATORY_CARE_PROVIDER_SITE_OTHER): Payer: Managed Care, Other (non HMO) | Admitting: Family Medicine

## 2019-06-23 ENCOUNTER — Encounter: Payer: Self-pay | Admitting: Family Medicine

## 2019-06-23 ENCOUNTER — Other Ambulatory Visit: Payer: Self-pay

## 2019-06-23 VITALS — BP 122/72 | HR 104 | Temp 98.6°F | Ht 63.0 in | Wt 191.4 lb

## 2019-06-23 DIAGNOSIS — R1031 Right lower quadrant pain: Secondary | ICD-10-CM | POA: Insufficient documentation

## 2019-06-23 NOTE — Assessment & Plan Note (Addendum)
No red flags for appendicitis... minimal pain and no rebound/guarding.  More likely constipation vs ovarian cyst pain.  Given minimal symptoms, improving.. will treat with miralax and NSAIDS for pain.  If pain persisting.. pt does not need repeat OV.. will progress to cbc to eval wbc as well as pelvic/transvaginal US to image ovary.   Not clearly related to recent COVID19 infeciton ( now symptoms resolved)   ER precautions given to patient.

## 2019-06-23 NOTE — Progress Notes (Signed)
Chief Complaint  Patient presents with  . abdominal tenderness    x 1 week, lower right side, occ. sharp pain    History of Present Illness: HPI  39 year old female presents following recent  COVID infection from 1/14 to 1/24. (positive test 06/08/2019) Had persistent fever which has now resolved.  Last fever 1 week ago  Respiratory symptoms: Resolved. NO SOB.   Now in last week she has had ttp in RLQ, sharp pain.  Started 1/16 with N/V but now in last week achy in RLQ, suprapubic 2/10 on pain scale. occ sharp twinge 5/10 on pain scale.  Pain is better today 1/20 on pain scale Nml UA on  06/16/2019.  No current nausea vomiting.  Did note small blood on toilet tissue 2-3 days ago. None since She has daily BM, but maybe less regular, every other day.   No current urinary symptoms.  No past abdominal surgery.   She has used tylenol. She takes fiber in coffee.  Started menses 06/22/2019, regular.  This visit occurred during the SARS-CoV-2 public health emergency.  Safety protocols were in place, including screening questions prior to the visit, additional usage of staff PPE, and extensive cleaning of exam room while observing appropriate contact time as indicated for disinfecting solutions.   COVID 19 screen:  No recent travel or known exposure to COVID19 The patient denies respiratory symptoms of COVID 19 at this time. The importance of social distancing was discussed today.     Review of Systems  Constitutional: Negative for chills and fever.  HENT: Negative for congestion and ear pain.   Eyes: Negative for pain and redness.  Respiratory: Negative for cough and shortness of breath.   Cardiovascular: Negative for chest pain, palpitations and leg swelling.  Gastrointestinal: Positive for abdominal pain, blood in stool and constipation. Negative for diarrhea, nausea and vomiting.  Genitourinary: Negative for dysuria.  Musculoskeletal: Negative for falls and myalgias.  Skin:  Negative for rash.  Neurological: Negative for dizziness.  Psychiatric/Behavioral: Negative for depression. The patient is not nervous/anxious.       Past Medical History:  Diagnosis Date  . Allergy    Allergies  . Frequent headaches   . Migraines     reports that she has never smoked. She has never used smokeless tobacco. She reports that she does not drink alcohol or use drugs.   Current Outpatient Medications:  .  cetirizine (ZYRTEC) 10 MG tablet, Take 10 mg by mouth daily. Reported on 06/16/2015, Disp: , Rfl:  .  acetaminophen (TYLENOL) 500 MG tablet, Take 500 mg by mouth every 6 (six) hours as needed., Disp: , Rfl:  .  aspirin 325 MG tablet, , Disp: , Rfl:    Observations/Objective: Blood pressure 122/72, pulse (!) 104, temperature 98.6 F (37 C), height 5\' 3"  (1.6 m), weight 191 lb 6.4 oz (86.8 kg), last menstrual period 06/23/2019, SpO2 98 %.  Physical Exam Constitutional:      General: She is not in acute distress.    Appearance: Normal appearance. She is well-developed. She is not ill-appearing or toxic-appearing.  HENT:     Head: Normocephalic.     Right Ear: Hearing, tympanic membrane, ear canal and external ear normal. Tympanic membrane is not erythematous, retracted or bulging.     Left Ear: Hearing, tympanic membrane, ear canal and external ear normal. Tympanic membrane is not erythematous, retracted or bulging.     Nose: No mucosal edema or rhinorrhea.     Right Sinus:  No maxillary sinus tenderness or frontal sinus tenderness.     Left Sinus: No maxillary sinus tenderness or frontal sinus tenderness.     Mouth/Throat:     Pharynx: Uvula midline.  Eyes:     General: Lids are normal. Lids are everted, no foreign bodies appreciated.     Conjunctiva/sclera: Conjunctivae normal.     Pupils: Pupils are equal, round, and reactive to light.  Neck:     Thyroid: No thyroid mass or thyromegaly.     Vascular: No carotid bruit.     Trachea: Trachea normal.   Cardiovascular:     Rate and Rhythm: Normal rate and regular rhythm.     Pulses: Normal pulses.     Heart sounds: Normal heart sounds, S1 normal and S2 normal. No murmur. No friction rub. No gallop.   Pulmonary:     Effort: Pulmonary effort is normal. No tachypnea or respiratory distress.     Breath sounds: Normal breath sounds. No decreased breath sounds, wheezing, rhonchi or rales.  Abdominal:     General: Bowel sounds are normal. There is no distension.     Palpations: Abdomen is soft. There is no shifting dullness, hepatomegaly, mass or pulsatile mass.     Tenderness: There is abdominal tenderness in the right lower quadrant and suprapubic area. There is no right CVA tenderness, left CVA tenderness or guarding. Negative signs include Murphy's sign, psoas sign and obturator sign.     Hernia: No hernia is present.  Musculoskeletal:     Cervical back: Normal range of motion and neck supple.  Skin:    General: Skin is warm and dry.     Findings: No rash.  Neurological:     Mental Status: She is alert.  Psychiatric:        Mood and Affect: Mood is not anxious or depressed.        Speech: Speech normal.        Behavior: Behavior normal. Behavior is cooperative.        Thought Content: Thought content normal.        Judgment: Judgment normal.      Assessment and Plan   Abdominal pain, RLQ No red flags for appendicitis... minimal pain and no rebound/guarding.  More likely constipation vs ovarian cyst pain.  Given minimal symptoms, improving.. will treat with miralax and NSAIDS for pain.  If pain persisting.. pt does not need repeat OV.. will progress to cbc to eval wbc as well as pelvic/transvaginal US to image ovary.   Not clearly related to recent [place] ( now symptoms resolved)   ER precautions given to patient.     Eliezer Lofts, MD

## 2019-06-23 NOTE — Patient Instructions (Addendum)
Increase water, continue fiber add daily miralax 17g daily.  Ibuprofen 600-800 mg 2-3 times daily for pain if needed as long as no stomach upset.  Call in 1-2 weeks if not improving as expected.. No repeat OV needed .. will then move forward with cbc and possible pelvic US.  Go to ER if severe abdominal pain, fever, uncontrollable vomiting.

## 2019-06-25 ENCOUNTER — Ambulatory Visit: Payer: Managed Care, Other (non HMO) | Admitting: Family Medicine

## 2020-05-31 ENCOUNTER — Ambulatory Visit: Payer: Self-pay | Admitting: Family Medicine

## 2020-06-07 ENCOUNTER — Ambulatory Visit: Payer: Self-pay | Admitting: Family Medicine

## 2020-06-14 ENCOUNTER — Ambulatory Visit: Payer: Self-pay | Admitting: Family Medicine

## 2020-06-18 ENCOUNTER — Telehealth: Payer: Self-pay | Admitting: Family Medicine

## 2020-06-18 DIAGNOSIS — R7303 Prediabetes: Secondary | ICD-10-CM

## 2020-06-18 NOTE — Telephone Encounter (Signed)
-----   Message from Alvina Chou sent at 06/13/2020  3:02 PM EST ----- Regarding: Lab orders for Monday, 1.31.22 Patient is scheduled for CPX labs, please order future labs, Thanks , Camelia Eng

## 2020-06-20 ENCOUNTER — Other Ambulatory Visit (INDEPENDENT_AMBULATORY_CARE_PROVIDER_SITE_OTHER): Payer: BC Managed Care – PPO

## 2020-06-20 ENCOUNTER — Other Ambulatory Visit: Payer: Self-pay

## 2020-06-20 DIAGNOSIS — R7303 Prediabetes: Secondary | ICD-10-CM

## 2020-06-20 LAB — COMPREHENSIVE METABOLIC PANEL
ALT: 10 U/L (ref 0–35)
AST: 13 U/L (ref 0–37)
Albumin: 4.3 g/dL (ref 3.5–5.2)
Alkaline Phosphatase: 46 U/L (ref 39–117)
BUN: 13 mg/dL (ref 6–23)
CO2: 27 mEq/L (ref 19–32)
Calcium: 9.5 mg/dL (ref 8.4–10.5)
Chloride: 104 mEq/L (ref 96–112)
Creatinine, Ser: 0.9 mg/dL (ref 0.40–1.20)
GFR: 79.71 mL/min (ref >60.00)
Glucose, Bld: 92 mg/dL (ref 70–99)
Potassium: 4.2 mEq/L (ref 3.5–5.1)
Sodium: 139 mEq/L (ref 135–145)
Total Bilirubin: 0.5 mg/dL (ref 0.2–1.2)
Total Protein: 7.3 g/dL (ref 6.0–8.3)

## 2020-06-20 LAB — LIPID PANEL
Cholesterol: 235 mg/dL — ABNORMAL HIGH (ref 0–200)
HDL: 54.8 mg/dL (ref >39.00)
LDL Cholesterol: 147 mg/dL — ABNORMAL HIGH (ref 0–99)
NonHDL: 179.92
Total CHOL/HDL Ratio: 4
Triglycerides: 163 mg/dL — ABNORMAL HIGH (ref 0.0–149.0)
VLDL: 32.6 mg/dL (ref 0.0–40.0)

## 2020-06-20 LAB — HEMOGLOBIN A1C: Hgb A1c MFr Bld: 5.7 % (ref 4.6–6.5)

## 2020-06-21 NOTE — Progress Notes (Signed)
No critical labs need to be addressed urgently. We will discuss labs in detail at upcoming office visit.   

## 2020-06-23 ENCOUNTER — Encounter: Payer: Self-pay | Admitting: Family Medicine

## 2020-06-23 ENCOUNTER — Ambulatory Visit (INDEPENDENT_AMBULATORY_CARE_PROVIDER_SITE_OTHER): Payer: BC Managed Care – PPO | Admitting: Family Medicine

## 2020-06-23 ENCOUNTER — Other Ambulatory Visit (HOSPITAL_COMMUNITY)
Admission: RE | Admit: 2020-06-23 | Discharge: 2020-06-23 | Disposition: A | Discharge status: Home / Self Care | Payer: BC Managed Care – PPO | Source: Ambulatory Visit | Attending: Family Medicine | Admitting: Family Medicine

## 2020-06-23 ENCOUNTER — Other Ambulatory Visit: Payer: Self-pay

## 2020-06-23 VITALS — BP 140/90 | HR 94 | Temp 98.6°F | Ht 63.5 in | Wt 195.5 lb

## 2020-06-23 DIAGNOSIS — N8110 Cystocele, unspecified: Secondary | ICD-10-CM

## 2020-06-23 DIAGNOSIS — Z124 Encounter for screening for malignant neoplasm of cervix: Secondary | ICD-10-CM

## 2020-06-23 DIAGNOSIS — R1011 Right upper quadrant pain: Secondary | ICD-10-CM

## 2020-06-23 DIAGNOSIS — R2231 Localized swelling, mass and lump, right upper limb: Secondary | ICD-10-CM

## 2020-06-23 DIAGNOSIS — K5900 Constipation, unspecified: Secondary | ICD-10-CM | POA: Diagnosis not present

## 2020-06-23 DIAGNOSIS — R1031 Right lower quadrant pain: Secondary | ICD-10-CM

## 2020-06-23 DIAGNOSIS — R7303 Prediabetes: Secondary | ICD-10-CM

## 2020-06-23 DIAGNOSIS — Z1159 Encounter for screening for other viral diseases: Secondary | ICD-10-CM | POA: Diagnosis not present

## 2020-06-23 DIAGNOSIS — Z0000 Encounter for general adult medical examination without abnormal findings: Secondary | ICD-10-CM | POA: Diagnosis not present

## 2020-06-23 LAB — CBC WITH DIFFERENTIAL/PLATELET
Basophils Absolute: 0 10*3/uL (ref 0.0–0.1)
Basophils Relative: 0.8 % (ref 0.0–3.0)
Eosinophils Absolute: 0.2 10*3/uL (ref 0.0–0.7)
Eosinophils Relative: 2.8 % (ref 0.0–5.0)
HCT: 40 % (ref 36.0–46.0)
Hemoglobin: 13.2 g/dL (ref 12.0–15.0)
Lymphocytes Relative: 27.4 % (ref 12.0–46.0)
Lymphs Abs: 1.5 10*3/uL (ref 0.7–4.0)
MCHC: 33 g/dL (ref 30.0–36.0)
MCV: 80.2 fl (ref 78.0–100.0)
Monocytes Absolute: 0.5 10*3/uL (ref 0.1–1.0)
Monocytes Relative: 8.9 % (ref 3.0–12.0)
Neutro Abs: 3.2 10*3/uL (ref 1.4–7.7)
Neutrophils Relative %: 60.1 % (ref 43.0–77.0)
Platelets: 264 10*3/uL (ref 150.0–400.0)
RBC: 4.99 Mil/uL (ref 3.87–5.11)
RDW: 14.9 % (ref 11.5–15.5)
WBC: 5.4 10*3/uL (ref 4.0–10.5)

## 2020-06-23 LAB — LIPASE: Lipase: 16 U/L (ref 11.0–59.0)

## 2020-06-23 NOTE — Assessment & Plan Note (Signed)
Encouraged exercise, weight loss, healthy eating habits. ? ?

## 2020-06-23 NOTE — Patient Instructions (Addendum)
Increase fiber in diet, increase water.  Can use miralax as needed.  Please stop at the lab to have labs drawn.  Work on low fat low cholesterol diet, exercise and weight loss.  Please call the location of your choice from the menu below to schedule your Mammogram and/or Bone Density appointment.    [place]   1. [facility]                      Phone:  [phone] [address]                               [place], [place] [address]                                                             Services: Traditional and 3D Mammogram, Bone Density   2. [facility]                 Phone: [phone] [address]                                                       [place], [place] [address]    Service: Bone Density ONLY   *this site does NOT perform mammograms  3. [facility]                        Phone:  [phone] [address]                                  [place], [place] [address]                                            Services:  3D Mammogram and Bone Density    [place]  1. [facility]   Phone:  [phone]   [address]                                                                            [place], [place] [address]                                            Services: 3D Mammogram and Bone Density  2. [facility])  Phone:  [phone]   [address]  [place], [place] [address]                                              Services:  3D Mammogram and Bone Density

## 2020-06-23 NOTE — Assessment & Plan Note (Signed)
Mild reviewed dx with pt. No current symptoms but has noted in past.  No current treatment needed.

## 2020-06-23 NOTE — Assessment & Plan Note (Signed)
Likely was resolved sebaceous cyst.  Only small skin tag present. No lymphadenopathy.

## 2020-06-23 NOTE — Assessment & Plan Note (Signed)
Gallbladder disease likely vs GERD/gastritis vs pancreatitis. Nml LFTs  Eval with cbc and lipase.  Eval with RUQ [place] to look for gallbladder disease.  Decrease fat in diet.  ER precautions given.

## 2020-06-23 NOTE — Assessment & Plan Note (Signed)
Increase fiber, water and can use miralax prn.

## 2020-06-23 NOTE — Progress Notes (Signed)
Patient ID: Patricia Baird, female    DOB: [birth year], 40 y.o.   MRN: [id number]  This visit was conducted in person.  BP 140/90   Pulse 94   Temp 98.6 F (37 C) (Temporal)   Ht 5' 3.5" (1.613 m)   Wt 195 lb 8 oz (88.7 kg)   LMP 06/08/2020   SpO2 98%   BMI 34.09 kg/m    CC:  Chief Complaint  Patient presents with  . Annual Exam    Subjective:   HPI: Patricia Baird is a 40 y.o. female presenting on 06/23/2020 for Annual Exam  2 new issues: She as noted a lump under right axillae, size of a nut.  Noted first  After 01/2020.. resolved after 1 day.  After mid Novemeber 2021 the area returned. Was size of large nut. No redness, no heat, slightly sore.  over time the are has shrunk back sown to almost unnoticeable.  Occ pain in RUQ  Intermittently.  IN Dec was daily occurrence, now occurring about every week. (2-3/10 on pain scale, occ 5/10. Occurs after eating, lasts 1-2 hours. Occ longer.  Tylenol helps.  Occ constipation, no nausea and vomiting.  One episode of blood streak after episode of straining.  Rare GERD.  No radiation of pain to back.Marland Kitchen occ ribs sore.  Reviewed labs in detail with patient.  Lab Results  Component Value Date   CHOL 235 (H) 06/20/2020   HDL 54.80 06/20/2020   LDLCALC 147 (H) 06/20/2020   LDLDIRECT 147.0 03/12/2017   TRIG 163.0 (H) 06/20/2020   CHOLHDL 4 06/20/2020    Prediabetes:  Lab Results  Component Value Date   HGBA1C 5.7 06/20/2020     Exercise: daily work out Engineer, [employer].  Diet: moderate.. some fast food  Wt Readings from Last 3 Encounters:  06/23/20 195 lb 8 oz (88.7 kg)  06/23/19 191 lb 6.4 oz (86.8 kg)  05/19/19 196 lb 12 oz (89.2 kg)    BPs at home 120/80  BP Readings from Last 3 Encounters:  06/23/20 140/90  06/23/19 122/72  05/19/19 128/86        Relevant past medical, surgical, family and social history reviewed and updated as indicated. Interim medical history since our last visit reviewed. Allergies  and medications reviewed and updated. Outpatient Medications Prior to Visit  Medication Sig Dispense Refill  . acetaminophen (TYLENOL) 500 MG tablet Take 500 mg by mouth every 6 (six) hours as needed.    Marland Kitchen aspirin 325 MG tablet     . cetirizine (ZYRTEC) 10 MG tablet Take 10 mg by mouth daily. Reported on 06/16/2015    . ibuprofen (ADVIL) 200 MG tablet      No facility-administered medications prior to visit.     Per HPI unless specifically indicated in ROS section below Review of Systems  Constitutional: Negative for fatigue and fever.  HENT: Negative for congestion.   Eyes: Negative for pain.  Respiratory: Negative for cough and shortness of breath.   Cardiovascular: Negative for chest pain, palpitations and leg swelling.  Gastrointestinal: Positive for constipation. Negative for abdominal pain.  Genitourinary: Negative for dysuria and vaginal bleeding.  Musculoskeletal: Negative for back pain.  Neurological: Negative for syncope, light-headedness and headaches.  Psychiatric/Behavioral: Negative for dysphoric mood.   Objective:  BP 140/90   Pulse 94   Temp 98.6 F (37 C) (Temporal)   Ht 5' 3.5" (1.613 m)   Wt 195 lb 8 oz (88.7 kg)   LMP 06/08/2020   SpO2  98%   BMI 34.09 kg/m   Wt Readings from Last 3 Encounters:  06/23/20 195 lb 8 oz (88.7 kg)  06/23/19 191 lb 6.4 oz (86.8 kg)  05/19/19 196 lb 12 oz (89.2 kg)      Physical Exam Exam conducted with a chaperone present.  Constitutional:      General: She is not in acute distress.Vital signs are normal.     Appearance: Normal appearance. She is well-developed and well-nourished. She is not ill-appearing or toxic-appearing.  HENT:     Head: Normocephalic.     Right Ear: Hearing, tympanic membrane, ear canal and external ear normal.     Left Ear: Hearing, tympanic membrane, ear canal and external ear normal.     Nose: Nose normal.  Eyes:     General: Lids are normal. Lids are everted, no foreign bodies appreciated.      Extraocular Movements: EOM normal.     Conjunctiva/sclera: Conjunctivae normal.     Pupils: Pupils are equal, round, and reactive to light.  Neck:     Thyroid: No thyroid mass or thyromegaly.     Vascular: No carotid bruit.     Trachea: Trachea normal.  Cardiovascular:     Rate and Rhythm: Normal rate and regular rhythm.     Pulses: Intact distal pulses.     Heart sounds: Normal heart sounds, S1 normal and S2 normal. No murmur heard. No gallop.   Pulmonary:     Effort: Pulmonary effort is normal. No respiratory distress.     Breath sounds: Normal breath sounds. No wheezing, rhonchi or rales.  Chest:  Breasts: No discharge from either breast. No tenderness and bleeding.    Abdominal:     General: Bowel sounds are normal. There is no distension or abdominal bruit.     Palpations: Abdomen is soft. There is no fluid wave, hepatosplenomegaly or mass.     Tenderness: There is no abdominal tenderness. There is no CVA tenderness, guarding or rebound.     Hernia: No hernia is present.  Genitourinary:    Exam position: Supine.     Labia:        Right: No rash, tenderness or lesion.        Left: No rash, tenderness or lesion.      Vagina: Normal.     Cervix: No cervical motion tenderness, discharge or friability.     Uterus: Normal. Not enlarged and not tender.      Adnexa:        Right: No mass, tenderness or fullness.         Left: No mass, tenderness or fullness.       Comments: Small cyctocele noted mainly with bearing down. Musculoskeletal:     Cervical back: Normal range of motion and neck supple.  Lymphadenopathy:     Cervical: No cervical adenopathy.     Upper Body:  No axillary adenopathy present. Skin:    General: Skin is warm, dry and intact.     Findings: No rash.     Comments: Small non irritated skin tag in right axillae, no mass  Neurological:     Mental Status: She is alert.     Cranial Nerves: No cranial nerve deficit.     Sensory: No sensory deficit.     Deep  Tendon Reflexes: Strength normal.  Psychiatric:        Mood and Affect: Mood is not anxious or depressed.        Speech: Speech  normal.        Behavior: Behavior normal. Behavior is cooperative.        Cognition and Memory: Cognition and memory normal.        Judgment: Judgment normal.       Results for orders placed or performed in visit on 06/20/20  Comprehensive metabolic panel  Result Value Ref Range   Sodium 139 135 - 145 mEq/L   Potassium 4.2 3.5 - 5.1 mEq/L   Chloride 104 96 - 112 mEq/L   CO2 27 19 - 32 mEq/L   Glucose, Bld 92 70 - 99 mg/dL   BUN 13 6 - 23 mg/dL   Creatinine, Ser 0.08 0.40 - 1.20 mg/dL   Total Bilirubin 0.5 0.2 - 1.2 mg/dL   Alkaline Phosphatase 46 39 - 117 U/L   AST 13 0 - 37 U/L   ALT 10 0 - 35 U/L   Total Protein 7.3 6.0 - 8.3 g/dL   Albumin 4.3 3.5 - 5.2 g/dL   GFR 67.61 >95.09 mL/min   Calcium 9.5 8.4 - 10.5 mg/dL  Lipid panel  Result Value Ref Range   Cholesterol 235 (H) 0 - 200 mg/dL   Triglycerides 326.7 (H) 0.0 - 149.0 mg/dL   HDL 12.45 >80.99 mg/dL   VLDL 83.3 0.0 - 82.5 mg/dL   LDL Cholesterol 053 (H) 0 - 99 mg/dL   Total CHOL/HDL Ratio 4    NonHDL 179.92   Hemoglobin A1c  Result Value Ref Range   Hgb A1c MFr Bld 5.7 4.6 - 6.5 %    This visit occurred during the SARS-CoV-2 public health emergency.  Safety protocols were in place, including screening questions prior to the visit, additional usage of staff PPE, and extensive cleaning of exam room while observing appropriate contact time as indicated for disinfecting solutions.   COVID 19 screen:  No recent travel or known exposure to COVID19 The patient denies respiratory symptoms of COVID 19 at this time. The importance of social distancing was discussed today.   Assessment and Plan The patient's preventative maintenance and recommended screening tests for an annual wellness exam were reviewed in full today. Brought up to date unless services declined.  Counselled on the importance  of diet, exercise, and its role in overall health and mortality. The patient's FH and SH was reviewed, including their home life, tobacco status, and drug and alcohol status.   Vaccines:uptodatetdap, Refuses flu vaccine. S/P 2 COVID vaccines Pap/DVE:07/2015 nml pap, neg HPV.Marland Kitchen Repeat in 5 years. DUE MGM ovarian cancer age 1, no clear indication for DVE. No early family history of breast cancer or colon cancer. Mammogram: Due Smoking Status:none ETOH/ drug ZJQ:BHAL/PFXT HIV screen:completed.  Hep c: will check with next labs.   Problem List Items Addressed This Visit    Abdominal pain, RLQ     Gallbladder disease likely vs GERD/gastritis vs pancreatitis. Nml LFTs  Eval with cbc and lipase.  Eval with RUQ [place] to look for gallbladder disease.  Decrease fat in diet.  ER precautions given.      Axillary mass, right    Likely was resolved sebaceous cyst.  Only small skin tag present. No lymphadenopathy.      Constipation    Increase fiber, water and can use miralax prn.      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 No current symptoms but has noted in past.  No current treatment needed.      Prediabetes  Encouraged exercise, weight loss, healthy eating habits.       RUQ pain   Relevant Orders   US ABDOMEN LIMITED RUQ (LIVER/GB)   CBC with Differential/Platelet   Lipase    Other Visit Diagnoses    Routine general medical examination at a health care facility    -  Primary   Cervical cancer screening       Relevant Orders   Cytology - PAP([place])   Need for hepatitis C screening test       Relevant Orders   Hepatitis C antibody      Orders Placed This Encounter  Procedures  . US ABDOMEN LIMITED RUQ (LIVER/GB)    Order Specific Question:   Reason for Exam (SYMPTOM  OR DIAGNOSIS REQUIRED)    Answer:   RUQ pain    Order Specific Question:   Preferred imaging location?    Answer:   [facility]  . CBC with  Differential/Platelet  . Hepatitis C antibody  . Lipase    Kerby Nora, MD

## 2020-06-24 LAB — HEPATITIS C ANTIBODY
Hepatitis C Ab: NONREACTIVE
SIGNAL TO CUT-OFF: 0.01 (ref <1.00)

## 2020-06-27 LAB — CYTOLOGY - PAP
Comment: NEGATIVE
Diagnosis: NEGATIVE
High risk HPV: NEGATIVE

## 2020-06-29 ENCOUNTER — Other Ambulatory Visit: Payer: BC Managed Care – PPO

## 2020-07-07 ENCOUNTER — Ambulatory Visit: Payer: BC Managed Care – PPO

## 2020-07-12 ENCOUNTER — Ambulatory Visit
Admission: RE | Admit: 2020-07-12 | Discharge: 2020-07-12 | Disposition: A | Discharge status: Home / Self Care | Payer: BC Managed Care – PPO | Source: Ambulatory Visit | Attending: Family Medicine | Admitting: Family Medicine

## 2020-07-12 ENCOUNTER — Other Ambulatory Visit: Payer: Self-pay

## 2020-07-12 DIAGNOSIS — R1011 Right upper quadrant pain: Secondary | ICD-10-CM | POA: Diagnosis not present

## 2020-07-19 ENCOUNTER — Other Ambulatory Visit: Payer: Self-pay | Admitting: Family Medicine

## 2020-07-19 DIAGNOSIS — Z1231 Encounter for screening mammogram for malignant neoplasm of breast: Secondary | ICD-10-CM

## 2020-08-09 ENCOUNTER — Other Ambulatory Visit: Payer: Self-pay

## 2020-08-09 ENCOUNTER — Ambulatory Visit
Admission: RE | Admit: 2020-08-09 | Discharge: 2020-08-09 | Disposition: A | Discharge status: Home / Self Care | Payer: BC Managed Care – PPO | Source: Ambulatory Visit | Attending: Family Medicine | Admitting: Family Medicine

## 2020-08-09 DIAGNOSIS — Z1231 Encounter for screening mammogram for malignant neoplasm of breast: Secondary | ICD-10-CM | POA: Insufficient documentation

## 2020-10-07 ENCOUNTER — Ambulatory Visit: Payer: BC Managed Care – PPO | Admitting: Family Medicine

## 2020-10-07 NOTE — Telephone Encounter (Signed)
[facility] Night - Client TELEPHONE ADVICE RECORD Access[employer]urse Patient Name: Patricia Baird Gender: Female DOB: Sep 28, 1980 Age: 40 Y 3 M 10 D Return Phone Number: [phone] (Primary) Address: City/ State/ Zip: [place] [address] [facility] Night - Client Client Site  [facility] [place] - Night Physician Kerby Nora - MD Contact Type Call Who Is Calling Patient / Member / Family / Caregiver Call Type Triage / Clinical Relationship To Patient Self Return Phone Number [phone] (Primary) Chief Complaint Finger Pain Reason for Call Request to Schedule Office Appointment Initial Comment Caller states she wants to schedule an apt for today. Is having swelling on her thumb it is red, painful and has Baird blister near the nail. Began hurting Monday and tuesday it got real bad. Might have had Baird splinter Baird week or 2 ago while working in her garden but is not too sure. Translation No Nurse Assessment Nurse: Kizzie Bane, RN, Marylene Land Date/Time Lamount Cohen Time): 10/07/2020 7:14:14 AM Confirm and document reason for call. If symptomatic, describe symptoms. ---Caller states she has an abscess on her left thumb that developed after working in the garden and started getting worse since Tuesday. No fever Does the patient have any new or worsening symptoms? ---Yes Will Baird triage be completed? ---Yes Related visit to physician within the last 2 weeks? ---No Does the PT have any chronic conditions? (i.e. diabetes, asthma, this includes High risk factors for pregnancy, etc.) ---No Is the patient pregnant or possibly pregnant? (Ask all females between the ages of 14-55) ---No Is this Baird behavioral health or substance abuse call? ---No Guidelines Guideline Title Affirmed Question Affirmed Notes Nurse Date/Time (Eastern Time) Boil (Skin Abscess) SEVERE pain (e.g., excruciating) Kizzie Bane, RN, Marylene Land 10/07/2020  7:16:38 AM Disp. Time Lamount Cohen Time) Disposition Final User PLEASE NOTE: All timestamps contained within this report are represented as [place] Standard Time. CONFIDENTIALTY NOTICE: This fax transmission is intended only for the addressee. It contains information that is legally privileged, confidential or otherwise protected from use or disclosure. If you are not the intended recipient, you are strictly prohibited from reviewing, disclosing, copying using or disseminating any of this information or taking any action in reliance on or regarding this information. If you have received this fax in error, please notify us immediately by telephone so that we can arrange for its return to [place]. Phone: [phone], Toll-Free: [phone], Fax: [phone] Page: 2 of 2 Call Id: [id number] 10/07/2020 7:22:05 AM See HCP within 4 Hours (or PCP triage) Yes Kizzie Bane, RN, Rosalyn Charters Disagree/Comply Comply Caller Understands Yes PreDisposition Did not know what to do Care Advice Given Per Guideline SEE HCP (OR PCP TRIAGE) WITHIN 4 HOURS: CALL BACK IF: * You become worse CARE ADVICE per Boil (Skin Abscess) (Adult) guideline. Referrals REFERRED TO PCP OFFICE

## 2020-10-07 NOTE — Telephone Encounter (Signed)
Patricia Baird that cancelled appt today said pt said was getting worse and going to  Urgent care. FYI to Dr Ermalene Searing

## 2021-07-12 ENCOUNTER — Telehealth: Payer: Self-pay | Admitting: Family Medicine

## 2021-07-12 DIAGNOSIS — R7303 Prediabetes: Secondary | ICD-10-CM

## 2021-07-12 NOTE — Telephone Encounter (Signed)
-----   Message from Alvina Chou sent at 07/10/2021  3:44 PM EST ----- Regarding: Lab orders for Thursday, 3.2.23 Patient is scheduled for CPX labs, please order future labs, Thanks , Camelia Eng

## 2021-07-20 ENCOUNTER — Other Ambulatory Visit: Payer: Self-pay

## 2021-07-20 ENCOUNTER — Other Ambulatory Visit (INDEPENDENT_AMBULATORY_CARE_PROVIDER_SITE_OTHER): Payer: 59

## 2021-07-20 DIAGNOSIS — R7303 Prediabetes: Secondary | ICD-10-CM | POA: Diagnosis not present

## 2021-07-20 LAB — COMPREHENSIVE METABOLIC PANEL
ALT: 11 U/L (ref 0–35)
AST: 15 U/L (ref 0–37)
Albumin: 4.3 g/dL (ref 3.5–5.2)
Alkaline Phosphatase: 46 U/L (ref 39–117)
BUN: 12 mg/dL (ref 6–23)
CO2: 28 mEq/L (ref 19–32)
Calcium: 9.1 mg/dL (ref 8.4–10.5)
Chloride: 102 mEq/L (ref 96–112)
Creatinine, Ser: 0.86 mg/dL (ref 0.40–1.20)
GFR: 83.54 mL/min (ref >60.00)
Glucose, Bld: 102 mg/dL — ABNORMAL HIGH (ref 70–99)
Potassium: 3.9 mEq/L (ref 3.5–5.1)
Sodium: 137 mEq/L (ref 135–145)
Total Bilirubin: 0.5 mg/dL (ref 0.2–1.2)
Total Protein: 7 g/dL (ref 6.0–8.3)

## 2021-07-20 LAB — LIPID PANEL
Cholesterol: 223 mg/dL — ABNORMAL HIGH (ref 0–200)
HDL: 60.9 mg/dL (ref >39.00)
LDL Cholesterol: 143 mg/dL — ABNORMAL HIGH (ref 0–99)
NonHDL: 161.99
Total CHOL/HDL Ratio: 4
Triglycerides: 97 mg/dL (ref 0.0–149.0)
VLDL: 19.4 mg/dL (ref 0.0–40.0)

## 2021-07-20 LAB — HEMOGLOBIN A1C: Hgb A1c MFr Bld: 6.2 % (ref 4.6–6.5)

## 2021-07-20 NOTE — Progress Notes (Signed)
No critical labs need to be addressed urgently. We will discuss labs in detail at upcoming office visit.   

## 2021-07-24 ENCOUNTER — Other Ambulatory Visit: Payer: BC Managed Care – PPO

## 2021-07-25 ENCOUNTER — Ambulatory Visit: Payer: BC Managed Care – PPO | Admitting: Dermatology

## 2021-08-01 ENCOUNTER — Ambulatory Visit (INDEPENDENT_AMBULATORY_CARE_PROVIDER_SITE_OTHER): Payer: 59 | Admitting: Family Medicine

## 2021-08-01 ENCOUNTER — Encounter: Payer: Self-pay | Admitting: Family Medicine

## 2021-08-01 ENCOUNTER — Other Ambulatory Visit: Payer: Self-pay

## 2021-08-01 VITALS — BP 132/78 | HR 94 | Temp 99.1°F | Ht 64.0 in | Wt 202.8 lb

## 2021-08-01 DIAGNOSIS — Z0000 Encounter for general adult medical examination without abnormal findings: Secondary | ICD-10-CM | POA: Diagnosis not present

## 2021-08-01 NOTE — Progress Notes (Signed)
? ? Patient ID: Patricia Baird, female    DOB: 22-Aug-1980, 41 y.o.   MRN: [id number] ? ?This visit was conducted in person. ? ?BP 132/78 (BP Location: Left Arm, Patient Position: Sitting, Cuff Size: Large)   Pulse 94   Temp 99.1 ?F (37.3 ?C) (Oral)   Ht 5\' 4"  (1.626 m)   Wt 202 lb 12.8 oz (92 kg)   SpO2 99%   BMI 34.81 kg/m?   ? ?CC:  ?Chief Complaint  ?Patient presents with  ? Annual Exam  ? ? ?Subjective:  ? ?HPI: ?Patricia Baird is a 41 y.o. female presenting on 08/01/2021 for Annual Exam ? ?Pt doing well overall. ? ? Diet: recently off track, but planning to get back to it. ? Exercise: walking, 20,000 steps per day at [facility] ? ?Body mass index is 34.81 kg/m?. ? ?Wt Readings from Last 3 Encounters:  ?08/01/21 202 lb 12.8 oz (92 kg)  ?06/23/20 195 lb 8 oz (88.7 kg)  ?06/23/19 191 lb 6.4 oz (86.8 kg)  ? ? Prediabetes  ?Lab Results  ?Component Value Date  ? HGBA1C 6.2 07/20/2021  ? ? ?Reviewed labs in detail ? ?The 10-year ASCVD risk score (Arnett DK, et al., 2019) is: 0.7% ?  Values used to calculate the score: ?    Age: 12 years ?    Sex: Female ?    Is Non-Hispanic African American: No ?    Diabetic: No ?    Tobacco smoker: No ?    Systolic Blood Pressure: 132 mmHg ?    Is BP treated: No ?    HDL Cholesterol: 60.9 mg/dL ?    Total Cholesterol: 223 mg/dL ?   ? ?Relevant past medical, surgical, family and social history reviewed and updated as indicated. Interim medical history since our last visit reviewed. ?Allergies and medications reviewed and updated. ?Outpatient Medications Prior to Visit  ?Medication Sig Dispense Refill  ? acetaminophen (TYLENOL) 500 MG tablet Take 500 mg by mouth every 6 (six) hours as needed.    ? aspirin 325 MG tablet     ? cetirizine (ZYRTEC) 10 MG tablet Take 10 mg by mouth daily. Reported on 06/16/2015    ? ibuprofen (ADVIL) 200 MG tablet     ? ?No facility-administered medications prior to visit.  ?  ? ?Per HPI unless specifically indicated in ROS section below ?Review of  Systems  ?Constitutional:  Negative for fatigue and fever.  ?HENT:  Negative for congestion.   ?Eyes:  Negative for pain.  ?Respiratory:  Negative for cough and shortness of breath.   ?Cardiovascular:  Negative for chest pain, palpitations and leg swelling.  ?Gastrointestinal:  Negative for abdominal pain.  ?Genitourinary:  Negative for dysuria and vaginal bleeding.  ?Musculoskeletal:  Negative for back pain.  ?Neurological:  Negative for syncope, light-headedness and headaches.  ?Psychiatric/Behavioral:  Negative for dysphoric mood.   ?Objective:  ?BP 132/78 (BP Location: Left Arm, Patient Position: Sitting, Cuff Size: Large)   Pulse 94   Temp 99.1 ?F (37.3 ?C) (Oral)   Ht 5\' 4"  (1.626 m)   Wt 202 lb 12.8 oz (92 kg)   SpO2 99%   BMI 34.81 kg/m?   ?Wt Readings from Last 3 Encounters:  ?08/01/21 202 lb 12.8 oz (92 kg)  ?06/23/20 195 lb 8 oz (88.7 kg)  ?06/23/19 191 lb 6.4 oz (86.8 kg)  ?  ?  ?Physical Exam ?Vitals and nursing note reviewed.  ?Constitutional:   ?   General: She is not  in acute distress. ?   Appearance: Normal appearance. She is well-developed. She is not ill-appearing or toxic-appearing.  ?HENT:  ?   Head: Normocephalic.  ?   Right Ear: Hearing, tympanic membrane, ear canal and external ear normal.  ?   Left Ear: Hearing, tympanic membrane, ear canal and external ear normal.  ?   Nose: Nose normal.  ?Eyes:  ?   General: Lids are normal. Lids are everted, no foreign bodies appreciated.  ?   Conjunctiva/sclera: Conjunctivae normal.  ?   Pupils: Pupils are equal, round, and reactive to light.  ?Neck:  ?   Thyroid: No thyroid mass or thyromegaly.  ?   Vascular: No carotid bruit.  ?   Trachea: Trachea normal.  ?Cardiovascular:  ?   Rate and Rhythm: Normal rate and regular rhythm.  ?   Heart sounds: Normal heart sounds, S1 normal and S2 normal. No murmur heard. ?  No gallop.  ?Pulmonary:  ?   Effort: Pulmonary effort is normal. No respiratory distress.  ?   Breath sounds: Normal breath sounds. No  wheezing, rhonchi or rales.  ?Abdominal:  ?   General: Bowel sounds are normal. There is no distension or abdominal bruit.  ?   Palpations: Abdomen is soft. There is no fluid wave or mass.  ?   Tenderness: There is no abdominal tenderness. There is no guarding or rebound.  ?   Hernia: No hernia is present.  ?Musculoskeletal:  ?   Cervical back: Normal range of motion and neck supple.  ?Lymphadenopathy:  ?   Cervical: No cervical adenopathy.  ?Skin: ?   General: Skin is warm and dry.  ?   Findings: No rash.  ?Neurological:  ?   Mental Status: She is alert.  ?   Cranial Nerves: No cranial nerve deficit.  ?   Sensory: No sensory deficit.  ?Psychiatric:     ?   Mood and Affect: Mood is not anxious or depressed.     ?   Speech: Speech normal.     ?   Behavior: Behavior normal. Behavior is cooperative.     ?   Judgment: Judgment normal.  ? ?   ?Results for orders placed or performed in visit on 07/20/21  ?Comprehensive metabolic panel  ?Result Value Ref Range  ? Sodium 137 135 - 145 mEq/L  ? Potassium 3.9 3.5 - 5.1 mEq/L  ? Chloride 102 96 - 112 mEq/L  ? CO2 28 19 - 32 mEq/L  ? Glucose, Bld 102 (H) 70 - 99 mg/dL  ? BUN 12 6 - 23 mg/dL  ? Creatinine, Ser 0.86 0.40 - 1.20 mg/dL  ? Total Bilirubin 0.5 0.2 - 1.2 mg/dL  ? Alkaline Phosphatase 46 39 - 117 U/L  ? AST 15 0 - 37 U/L  ? ALT 11 0 - 35 U/L  ? Total Protein 7.0 6.0 - 8.3 g/dL  ? Albumin 4.3 3.5 - 5.2 g/dL  ? GFR 83.54 >60.00 mL/min  ? Calcium 9.1 8.4 - 10.5 mg/dL  ?Lipid panel  ?Result Value Ref Range  ? Cholesterol 223 (H) 0 - 200 mg/dL  ? Triglycerides 97.0 0.0 - 149.0 mg/dL  ? HDL 60.90 >39.00 mg/dL  ? VLDL 19.4 0.0 - 40.0 mg/dL  ? LDL Cholesterol 143 (H) 0 - 99 mg/dL  ? Total CHOL/HDL Ratio 4   ? NonHDL 161.99   ?Hemoglobin A1c  ?Result Value Ref Range  ? Hgb A1c MFr Bld 6.2 4.6 - 6.5 %  ? ? ?  This visit occurred during the SARS-CoV-2 public health emergency.  Safety protocols were in place, including screening questions prior to the visit, additional usage of  staff PPE, and extensive cleaning of exam room while observing appropriate contact time as indicated for disinfecting solutions.  ? ?COVID 19 screen:  No recent travel or known exposure to COVID19 ?The patient denies respiratory symptoms of COVID 19 at this time. ?The importance of social distancing was discussed today.  ? ?Assessment and Plan ?The patient's preventative maintenance and recommended screening tests for an annual wellness exam were reviewed in full today. ?Brought up to date unless services declined. ? ?Counselled on the importance of diet, exercise, and its role in overall health and mortality. ?The patient's FH and SH was reviewed, including their home life, tobacco status, and drug and alcohol status.  ? ?Vaccines: uptodate tdap,  Refuses flu vaccine. S/P 2 COVID vaccines ?Pap/DVE: 07/2020 nml pap, neg HPV.Marland Kitchen Repeat in 5 years ?MGM ovarian cancer age 55, no clear indication for DVE. ?No early family history of breast cancer or colon cancer. ?Mammogram: 08/09/20.. due ?Smoking Status: none ?ETOH/ drug use: none/none ?HIV screen:    completed. ? Hep c:  done ?  ? ?Kerby Nora, MD  ? ?

## 2021-08-01 NOTE — Patient Instructions (Signed)
Please call the location of your choice from the menu below to schedule your Mammogram and/or Bone Density appointment.    [place]   [facility]                      Phone:  [phone] [address]                               [place], [place] [address]                                                             Services: Traditional and 3D Mammogram, Bone Density   [facility]                 Phone: [phone] [address]                                                       [place], [place] [address]    Service: Bone Density ONLY   *this site does NOT perform mammograms  [facility]                        Phone:  [phone] [address]                                  [place], [place] [address]                                            Services:  3D Mammogram and Bone Density    [place]  [facility]   Phone:  [phone]   [address]                                                                            [place], [place] [address]                                            Services: 3D Mammogram and Bone Density  [facility] [place] ([facility])  Phone:  [phone]   [address]                        [place], [place] [address]                                                Services:  3D Mammogram and Bone Density  

## 2021-08-10 ENCOUNTER — Other Ambulatory Visit: Payer: Self-pay | Admitting: Family Medicine

## 2021-08-10 DIAGNOSIS — Z1231 Encounter for screening mammogram for malignant neoplasm of breast: Secondary | ICD-10-CM

## 2021-09-15 ENCOUNTER — Ambulatory Visit
Admission: RE | Admit: 2021-09-15 | Discharge: 2021-09-15 | Disposition: A | Discharge status: Home / Self Care | Payer: 59 | Source: Ambulatory Visit | Attending: Family Medicine | Admitting: Family Medicine

## 2021-09-15 DIAGNOSIS — Z1231 Encounter for screening mammogram for malignant neoplasm of breast: Secondary | ICD-10-CM

## 2021-11-07 ENCOUNTER — Ambulatory Visit: Payer: BC Managed Care – PPO | Admitting: Dermatology

## 2022-01-04 ENCOUNTER — Encounter: Payer: Self-pay | Admitting: Family Medicine

## 2022-01-04 ENCOUNTER — Ambulatory Visit (INDEPENDENT_AMBULATORY_CARE_PROVIDER_SITE_OTHER)
Admission: RE | Admit: 2022-01-04 | Discharge: 2022-01-04 | Disposition: A | Discharge status: Home / Self Care | Payer: BC Managed Care – PPO | Source: Ambulatory Visit | Attending: Family Medicine | Admitting: Family Medicine

## 2022-01-04 ENCOUNTER — Ambulatory Visit (INDEPENDENT_AMBULATORY_CARE_PROVIDER_SITE_OTHER): Payer: BC Managed Care – PPO | Admitting: Family Medicine

## 2022-01-04 VITALS — BP 138/96 | HR 100 | Temp 98.2°F | Ht 64.0 in | Wt 208.5 lb

## 2022-01-04 DIAGNOSIS — R109 Unspecified abdominal pain: Secondary | ICD-10-CM | POA: Diagnosis not present

## 2022-01-04 DIAGNOSIS — R202 Paresthesia of skin: Secondary | ICD-10-CM

## 2022-01-04 DIAGNOSIS — R200 Anesthesia of skin: Secondary | ICD-10-CM

## 2022-01-04 DIAGNOSIS — R0789 Other chest pain: Secondary | ICD-10-CM

## 2022-01-04 DIAGNOSIS — R0781 Pleurodynia: Secondary | ICD-10-CM | POA: Diagnosis not present

## 2022-01-04 LAB — COMPREHENSIVE METABOLIC PANEL
ALT: 10 U/L (ref 0–35)
AST: 15 U/L (ref 0–37)
Albumin: 4.5 g/dL (ref 3.5–5.2)
Alkaline Phosphatase: 53 U/L (ref 39–117)
BUN: 11 mg/dL (ref 6–23)
CO2: 24 mEq/L (ref 19–32)
Calcium: 9.4 mg/dL (ref 8.4–10.5)
Chloride: 105 mEq/L (ref 96–112)
Creatinine, Ser: 0.88 mg/dL (ref 0.40–1.20)
GFR: 81 mL/min (ref >60.00)
Glucose, Bld: 101 mg/dL — ABNORMAL HIGH (ref 70–99)
Potassium: 4.4 mEq/L (ref 3.5–5.1)
Sodium: 138 mEq/L (ref 135–145)
Total Bilirubin: 0.4 mg/dL (ref 0.2–1.2)
Total Protein: 7 g/dL (ref 6.0–8.3)

## 2022-01-04 LAB — VITAMIN B12: Vitamin B-12: 207 pg/mL — ABNORMAL LOW (ref 211–911)

## 2022-01-04 LAB — CBC WITH DIFFERENTIAL/PLATELET
Basophils Absolute: 0.1 10*3/uL (ref 0.0–0.1)
Basophils Relative: 1 % (ref 0.0–3.0)
Eosinophils Absolute: 0.2 10*3/uL (ref 0.0–0.7)
Eosinophils Relative: 2.7 % (ref 0.0–5.0)
HCT: 35.7 % — ABNORMAL LOW (ref 36.0–46.0)
Hemoglobin: 11 g/dL — ABNORMAL LOW (ref 12.0–15.0)
Lymphocytes Relative: 24.2 % (ref 12.0–46.0)
Lymphs Abs: 1.5 10*3/uL (ref 0.7–4.0)
MCHC: 30.8 g/dL (ref 30.0–36.0)
MCV: 68.9 fl — ABNORMAL LOW (ref 78.0–100.0)
Monocytes Absolute: 0.5 10*3/uL (ref 0.1–1.0)
Monocytes Relative: 8.3 % (ref 3.0–12.0)
Neutro Abs: 3.9 10*3/uL (ref 1.4–7.7)
Neutrophils Relative %: 63.8 % (ref 43.0–77.0)
Platelets: 279 10*3/uL (ref 150.0–400.0)
RBC: 5.19 Mil/uL — ABNORMAL HIGH (ref 3.87–5.11)
RDW: 19 % — ABNORMAL HIGH (ref 11.5–15.5)
WBC: 6.1 10*3/uL (ref 4.0–10.5)

## 2022-01-04 LAB — POC URINALSYSI DIPSTICK (AUTOMATED)
Bilirubin, UA: NEGATIVE
Blood, UA: NEGATIVE
Glucose, UA: NEGATIVE
Ketones, UA: NEGATIVE
Leukocytes, UA: NEGATIVE
Nitrite, UA: NEGATIVE
Protein, UA: NEGATIVE
Spec Grav, UA: <=1.005 — AB (ref 1.010–1.025)
Urobilinogen, UA: 0.2 E.U./dL
pH, UA: 6 (ref 5.0–8.0)

## 2022-01-04 LAB — HEMOGLOBIN A1C: Hgb A1c MFr Bld: 6.3 % (ref 4.6–6.5)

## 2022-01-04 LAB — LIPASE: Lipase: 22 U/L (ref 11.0–59.0)

## 2022-01-04 LAB — TSH: TSH: 1.45 u[IU]/mL (ref 0.35–5.50)

## 2022-01-04 NOTE — Patient Instructions (Addendum)
Please stop at the lab to have labs drawn and X-ray.  I will call you with results of tests.  Start home stretching of chest wall, limit repetitive upper body motion.. weed wacker etc.

## 2022-01-04 NOTE — Progress Notes (Signed)
Patient ID: Patricia Baird, female    DOB: 1981-04-25, 41 y.o.   MRN: [id number]  This visit was conducted in person.  BP (!) 138/96   Pulse 100   Temp 98.2 F (36.8 C) (Temporal)   Ht 5\' 4"  (1.626 m)   Wt 208 lb 8 oz (94.6 kg)   LMP 12/14/2021   SpO2 98%   BMI 35.79 kg/m    CC:  Chief Complaint  Patient presents with   Chest Pain    C/o ongoing R side rib pain.  Started mos ago. Also, c/o HA.     Subjective:   HPI: Patricia Baird is a 41 y.o. female  non smoker presenting on 01/04/2022 for Chest Pain (C/o ongoing R side rib pain.  Started mos ago. Also, c/o HA. )   In last 2-3 months she has been feeling a bruised are on right lateral chest wall. Feels achy, more bruised with pressure.  Pain has been intermittent. Occ sharp pain with turning. 1/10 today but occ 7/10  Has improved some at times with tylenol or ibuprofen.  Occ waking up at night.  No associated abd pain, no reflux. No change in pain  with eating.  She does use small hand weights and weed eats.  No dysuria, No constipation, no diarrhea  No blood in urine or stool.  No cough, no SOB.      Last 06/2020 IMPRESSION:Incidental 5 mm polyp.  Normal sonographic appearance of the liver.   Also  left " Numbness or burning sensation in left calf... small varicose viens... started in 2019.. intermittent.  Relevant past medical, surgical, family and social history reviewed and updated as indicated. Interim medical history since our last visit reviewed. Allergies and medications reviewed and updated. Outpatient Medications Prior to Visit  Medication Sig Dispense Refill   acetaminophen (TYLENOL) 500 MG tablet Take 500 mg by mouth every 6 (six) hours as needed.     aspirin 325 MG tablet      cetirizine (ZYRTEC) 10 MG tablet Take 10 mg by mouth daily. Reported on 06/16/2015     ibuprofen (ADVIL) 200 MG tablet      No facility-administered medications prior to visit.     Per HPI unless specifically  indicated in ROS section below Review of Systems  Constitutional:  Negative for fatigue and fever.  HENT:  Negative for congestion.   Eyes:  Negative for pain.  Respiratory:  Negative for cough and shortness of breath.   Cardiovascular:  Positive for chest pain. Negative for palpitations and leg swelling.  Gastrointestinal:  Negative for abdominal pain.  Genitourinary:  Negative for dysuria and vaginal bleeding.  Musculoskeletal:  Negative for back pain.  Neurological:  Negative for syncope, light-headedness and headaches.  Psychiatric/Behavioral:  Negative for dysphoric mood.    Objective:  BP (!) 138/96   Pulse 100   Temp 98.2 F (36.8 C) (Temporal)   Ht 5\' 4"  (1.626 m)   Wt 208 lb 8 oz (94.6 kg)   LMP 12/14/2021   SpO2 98%   BMI 35.79 kg/m   Wt Readings from Last 3 Encounters:  01/04/22 208 lb 8 oz (94.6 kg)  08/01/21 202 lb 12.8 oz (92 kg)  06/23/20 195 lb 8 oz (88.7 kg)      Physical Exam Constitutional:      General: She is not in acute distress.    Appearance: Normal appearance. She is well-developed. She is not ill-appearing or toxic-appearing.  HENT:  Head: Normocephalic.     Right Ear: Hearing, tympanic membrane, ear canal and external ear normal. Tympanic membrane is not erythematous, retracted or bulging.     Left Ear: Hearing, tympanic membrane, ear canal and external ear normal. Tympanic membrane is not erythematous, retracted or bulging.     Nose: No mucosal edema or rhinorrhea.     Right Sinus: No maxillary sinus tenderness or frontal sinus tenderness.     Left Sinus: No maxillary sinus tenderness or frontal sinus tenderness.     Mouth/Throat:     Pharynx: Uvula midline.  Eyes:     General: Lids are normal. Lids are everted, no foreign bodies appreciated.     Conjunctiva/sclera: Conjunctivae normal.     Pupils: Pupils are equal, round, and reactive to light.  Neck:     Thyroid: No thyroid mass or thyromegaly.     Vascular: No carotid bruit.      Trachea: Trachea normal.  Cardiovascular:     Rate and Rhythm: Normal rate and regular rhythm.     Pulses: Normal pulses.     Heart sounds: Normal heart sounds, S1 normal and S2 normal. No murmur heard.    No friction rub. No gallop.  Pulmonary:     Effort: Pulmonary effort is normal. No tachypnea or respiratory distress.     Breath sounds: Normal breath sounds. No decreased breath sounds, wheezing, rhonchi or rales.  Abdominal:     General: Bowel sounds are normal.     Palpations: Abdomen is soft.     Tenderness: There is abdominal tenderness.    Musculoskeletal:     Cervical back: Normal range of motion and neck supple.  Skin:    General: Skin is warm and dry.     Findings: No rash.  Neurological:     Mental Status: She is alert.  Psychiatric:        Mood and Affect: Mood is not anxious or depressed.        Speech: Speech normal.        Behavior: Behavior normal. Behavior is cooperative.        Thought Content: Thought content normal.        Judgment: Judgment normal.       Results for orders placed or performed in visit on 07/20/21  Comprehensive metabolic panel  Result Value Ref Range   Sodium 137 135 - 145 mEq/L   Potassium 3.9 3.5 - 5.1 mEq/L   Chloride 102 96 - 112 mEq/L   CO2 28 19 - 32 mEq/L   Glucose, Bld 102 (H) 70 - 99 mg/dL   BUN 12 6 - 23 mg/dL   Creatinine, Ser 5.45 0.40 - 1.20 mg/dL   Total Bilirubin 0.5 0.2 - 1.2 mg/dL   Alkaline Phosphatase 46 39 - 117 U/L   AST 15 0 - 37 U/L   ALT 11 0 - 35 U/L   Total Protein 7.0 6.0 - 8.3 g/dL   Albumin 4.3 3.5 - 5.2 g/dL   GFR 62.56 >38.93 mL/min   Calcium 9.1 8.4 - 10.5 mg/dL  Lipid panel  Result Value Ref Range   Cholesterol 223 (H) 0 - 200 mg/dL   Triglycerides 73.4 0.0 - 149.0 mg/dL   HDL 28.76 >81.15 mg/dL   VLDL 72.6 0.0 - 20.3 mg/dL   LDL Cholesterol 559 (H) 0 - 99 mg/dL   Total CHOL/HDL Ratio 4    NonHDL 161.99   Hemoglobin A1c  Result Value Ref Range   Hgb  A1c MFr Bld 6.2 4.6 - 6.5 %      COVID 19 screen:  No recent travel or known exposure to COVID19 The patient denies respiratory symptoms of COVID 19 at this time. The importance of social distancing was discussed today.   Assessment and Plan    Problem List Items Addressed This Visit     Numbness and tingling of left leg     Acute, unclear etiology.  Evaluate with labs      Relevant Orders   Vitamin B12 (Completed)   TSH (Completed)   Hemoglobin A1c (Completed)   Right flank pain - Primary   Relevant Orders   Comprehensive metabolic panel (Completed)   Lipase (Completed)   CBC with Differential/Platelet (Completed)   POCT Urinalysis Dipstick (Automated) (Completed)   Right-sided chest wall pain    Acute, Most likely consistent with chest wall strain but given persistence we will evaluate with rib films. Also evaluate with labs to rule out liver or pancreas referred pain. Start home stretching of chest wall, limit repetitive upper body motion.. weed wacker etc.      Relevant Orders   DG Ribs Unilateral Right (Completed)   Orders Placed This Encounter  Procedures   DG Ribs Unilateral Right    Standing Status:   Future    Number of Occurrences:   1    Standing Expiration Date:   01/05/2023    Order Specific Question:   Reason for Exam (SYMPTOM  OR DIAGNOSIS REQUIRED)    Answer:   right chest wall pain.Marland Kitchen lateral pain over lower ribs    Order Specific Question:   Is patient pregnant?    Answer:   No    Comments:   LMP 3 weeks ago    Order Specific Question:   Preferred imaging location?    Answer:   [facility]   Comprehensive metabolic panel   Lipase   Vitamin B12   TSH   Hemoglobin A1c   CBC with Differential/Platelet   POCT Urinalysis Dipstick (Automated)     Kerby Nora, MD

## 2022-01-27 NOTE — Assessment & Plan Note (Addendum)
Acute, Most likely consistent with chest wall strain but given persistence we will evaluate with rib films. Also evaluate with labs to rule out liver or pancreas referred pain. Start home stretching of chest wall, limit repetitive upper body motion.. weed wacker etc.

## 2022-01-27 NOTE — Assessment & Plan Note (Signed)
Acute, unclear etiology.  Evaluate with labs

## 2022-05-01 DIAGNOSIS — H16141 Punctate keratitis, right eye: Secondary | ICD-10-CM | POA: Diagnosis not present

## 2022-08-08 ENCOUNTER — Ambulatory Visit: Payer: BC Managed Care – PPO | Admitting: Dermatology

## 2022-08-08 ENCOUNTER — Encounter: Payer: Self-pay | Admitting: Dermatology

## 2022-08-08 VITALS — BP 162/91 | HR 92

## 2022-08-08 DIAGNOSIS — D492 Neoplasm of unspecified behavior of bone, soft tissue, and skin: Secondary | ICD-10-CM

## 2022-08-08 DIAGNOSIS — L821 Other seborrheic keratosis: Secondary | ICD-10-CM | POA: Diagnosis not present

## 2022-08-08 DIAGNOSIS — L814 Other melanin hyperpigmentation: Secondary | ICD-10-CM | POA: Diagnosis not present

## 2022-08-08 DIAGNOSIS — L578 Other skin changes due to chronic exposure to nonionizing radiation: Secondary | ICD-10-CM

## 2022-08-08 DIAGNOSIS — L720 Epidermal cyst: Secondary | ICD-10-CM | POA: Diagnosis not present

## 2022-08-08 DIAGNOSIS — Z1283 Encounter for screening for malignant neoplasm of skin: Secondary | ICD-10-CM

## 2022-08-08 DIAGNOSIS — D2239 Melanocytic nevi of other parts of face: Secondary | ICD-10-CM | POA: Diagnosis not present

## 2022-08-08 DIAGNOSIS — D229 Melanocytic nevi, unspecified: Secondary | ICD-10-CM

## 2022-08-08 NOTE — Patient Instructions (Addendum)
Wound Care Instructions  Cleanse wound gently with soap and water once a day then pat dry with clean gauze. Apply a thin coat of Petrolatum (petroleum jelly, "Vaseline") over the wound (unless you have an allergy to this). We recommend that you use a new, sterile tube of Vaseline. Do not pick or remove scabs. Do not remove the yellow or white "healing tissue" from the base of the wound.  Cover the wound with fresh, clean, nonstick gauze and secure with paper tape. You may use Band-Aids in place of gauze and tape if the wound is small enough, but would recommend trimming much of the tape off as there is often too much. Sometimes Band-Aids can irritate the skin.  You should call the office for your biopsy report after 1 week if you have not already been contacted.  If you experience any problems, such as abnormal amounts of bleeding, swelling, significant bruising, significant pain, or evidence of infection, please call the office immediately.  FOR ADULT SURGERY PATIENTS: If you need something for pain relief you may take 1 extra strength Tylenol (acetaminophen) AND 2 Ibuprofen (200mg each) together every 4 hours as needed for pain. (do not take these if you are allergic to them or if you have a reason you should not take them.) Typically, you may only need pain medication for 1 to 3 days.      Recommend daily broad spectrum sunscreen SPF 30+ to sun-exposed areas, reapply every 2 hours as needed. Call for new or changing lesions.  Staying in the shade or wearing long sleeves, sun glasses (UVA+UVB protection) and wide brim hats (4-inch brim around the entire circumference of the hat) are also recommended for sun protection.    Recommend taking Heliocare sun protection supplement daily in sunny weather for additional sun protection. For maximum protection on the sunniest days, you can take up to 2 capsules of regular Heliocare OR take 1 capsule of Heliocare Ultra. For prolonged exposure (such as a  full day in the sun), you can repeat your dose of the supplement 4 hours after your first dose. Heliocare can be purchased at [facility], at some [facility] or at www.heliocare.com.     Melanoma ABCDEs  Melanoma is the most dangerous type of skin cancer, and is the leading cause of death from skin disease.  You are more likely to develop melanoma if you: Have light-colored skin, light-colored eyes, or red or blond hair Spend a lot of time in the sun Tan regularly, either outdoors or in a tanning bed Have had blistering sunburns, especially during childhood Have a close family member who has had a melanoma Have atypical moles or large birthmarks  Early detection of melanoma is key since treatment is typically straightforward and cure rates are extremely high if we catch it early.   The first sign of melanoma is often a change in a mole or a new dark spot.  The ABCDE system is a way of remembering the signs of melanoma.  A for asymmetry:  The two halves do not match. B for border:  The edges of the growth are irregular. C for color:  A mixture of colors are present instead of an even brown color. D for diameter:  Melanomas are usually (but not always) greater than 6mm - the size of a pencil eraser. E for evolution:  The spot keeps changing in size, shape, and color.  Please check your skin once per month between visits. You can use a   small mirror in front and a large mirror behind you to keep an eye on the back side or your body.   If you see any new or changing lesions before your next follow-up, please call to schedule a visit.  Please continue daily skin protection including broad spectrum sunscreen SPF 30+ to sun-exposed areas, reapplying every 2 hours as needed when you're outdoors.   Staying in the shade or wearing long sleeves, sun glasses (UVA+UVB protection) and wide brim hats (4-inch brim around the entire circumference of the hat) are also recommended for sun  protection.     Due to recent changes in healthcare laws, you may see results of your pathology and/or laboratory studies on MyChart before the doctors have had a chance to review them. We understand that in some cases there may be results that are confusing or concerning to you. Please understand that not all results are received at the same time and often the doctors may need to interpret multiple results in order to provide you with the best plan of care or course of treatment. Therefore, we ask that you please give us 2 business days to thoroughly review all your results before contacting the office for clarification. Should we see a critical lab result, you will be contacted sooner.   If You Need Anything After Your Visit  If you have any questions or concerns for your doctor, please call our main line at [phone] and press option 4 to reach your doctor's medical assistant. If no one answers, please leave a voicemail as directed and we will return your call as soon as possible. Messages left after 4 pm will be answered the following business day.   You may also send us a message via MyChart. We typically respond to MyChart messages within 1-2 business days.  For prescription refills, please ask your pharmacy to contact our office. Our fax number is [phone].  If you have an urgent issue when the clinic is closed that cannot wait until the next business day, you can page your doctor at the number below.    Please note that while we do our best to be available for urgent issues outside of office hours, we are not available 24/7.   If you have an urgent issue and are unable to reach us, you may choose to seek medical care at your doctor's office, retail clinic, urgent care center, or emergency room.  If you have a medical emergency, please immediately call 911 or go to the emergency department.  Pager Numbers  - Dr. Kowalski: [phone]  - Dr. Moye: [phone]  - Dr.  Stewart: [phone]  In the event of inclement weather, please call our main line at [phone] for an update on the status of any delays or closures.  Dermatology Medication Tips: Please keep the boxes that topical medications come in in order to help keep track of the instructions about where and how to use these. Pharmacies typically print the medication instructions only on the boxes and not directly on the medication tubes.   If your medication is too expensive, please contact our office at [phone] option 4 or send us a message through MyChart.   We are unable to tell what your co-pay for medications will be in advance as this is different depending on your insurance coverage. However, we may be able to find a substitute medication at lower cost or fill out paperwork to get insurance to cover a needed medication.   If a   prior authorization is required to get your medication covered by your insurance company, please allow us 1-2 business days to complete this process.  Drug prices often vary depending on where the prescription is filled and some pharmacies may offer cheaper prices.  The website www.goodrx.com contains coupons for medications through different pharmacies. The prices here do not account for what the cost may be with help from insurance (it may be cheaper with your insurance), but the website can give you the price if you did not use any insurance.  - You can print the associated coupon and take it with your prescription to the pharmacy.  - You may also stop by our office during regular business hours and pick up a GoodRx coupon card.  - If you need your prescription sent electronically to a different pharmacy, notify our office through [facility] MyChart or by phone at [phone] option 4.     Si Usted Necesita Algo Despus de Su Visita  Tambin puede enviarnos un mensaje a travs de MyChart. Por lo general respondemos a los mensajes de MyChart en el transcurso  de 1 a 2 das hbiles.  Para renovar recetas, por favor pida a su farmacia que se ponga en contacto con nuestra oficina. Nuestro nmero de fax es el [phone].  Si tiene un asunto urgente cuando la clnica est cerrada y que no puede esperar hasta el siguiente da hbil, puede llamar/localizar a su doctor(a) al nmero que aparece a continuacin.   Por favor, tenga en cuenta que aunque hacemos todo lo posible para estar disponibles para asuntos urgentes fuera del horario de oficina, no estamos disponibles las 24 horas del da, los 7 das de la semana.   Si tiene un problema urgente y no puede comunicarse con nosotros, puede optar por buscar atencin mdica  en el consultorio de su doctor(a), en una clnica privada, en un centro de atencin urgente o en una sala de emergencias.  Si tiene una emergencia mdica, por favor llame inmediatamente al 911 o vaya a la sala de emergencias.  Nmeros de bper  - Dr. Kowalski: [phone]  - Dra. Moye: [phone]  - Dra. Stewart: [phone]  En caso de inclemencias del tiempo, por favor llame a nuestra lnea principal al [phone] para una actualizacin sobre el estado de cualquier retraso o cierre.  Consejos para la medicacin en dermatologa: Por favor, guarde las cajas en las que vienen los medicamentos de uso tpico para ayudarle a seguir las instrucciones sobre dnde y cmo usarlos. Las farmacias generalmente imprimen las instrucciones del medicamento slo en las cajas y no directamente en los tubos del medicamento.   Si su medicamento es muy caro, por favor, pngase en contacto con nuestra oficina llamando al [phone] y presione la opcin 4 o envenos un mensaje a travs de MyChart.   No podemos decirle cul ser su copago por los medicamentos por adelantado ya que esto es diferente dependiendo de la cobertura de su seguro. Sin embargo, es posible que podamos encontrar un medicamento sustituto a menor costo o llenar un formulario  para que el seguro cubra el medicamento que se considera necesario.   Si se requiere una autorizacin previa para que su compaa de seguros cubra su medicamento, por favor permtanos de 1 a 2 das hbiles para completar este proceso.  Los precios de los medicamentos varan con frecuencia dependiendo del lugar de dnde se surte la receta y alguna farmacias pueden ofrecer precios ms baratos.  El sitio web www.goodrx.com tiene cupones   para medicamentos de diferentes farmacias. Los precios aqu no tienen en cuenta lo que podra costar con la ayuda del seguro (puede ser ms barato con su seguro), pero el sitio web puede darle el precio si no utiliz ningn seguro.  - Puede imprimir el cupn correspondiente y llevarlo con su receta a la farmacia.  - Tambin puede pasar por nuestra oficina durante el horario de atencin regular y recoger una tarjeta de cupones de GoodRx.  - Si necesita que su receta se enve electrnicamente a una farmacia diferente, informe a nuestra oficina a travs de MyChart de [facility] o por telfono llamando al [phone] y presione la opcin 4.  

## 2022-08-08 NOTE — Progress Notes (Signed)
Follow-Up Visit   Subjective  Patricia Baird is a 42 y.o. female who presents for the following: Skin Cancer Screening and Full Body Skin Exam. No personal hx of skin cancer. Was seen by another dermatologist last year. Had moles on face removed. Would like moles on face removed today  The patient presents for Total-Body Skin Exam (TBSE) for skin cancer screening and mole check. The patient has spots, moles and lesions to be evaluated, some may be new or changing and the patient has concerns that these could be cancer.   The following portions of the chart were reviewed this encounter and updated as appropriate: medications, allergies, medical history  Review of Systems:  No other skin or systemic complaints except as noted in HPI or Assessment and Plan.  Objective  Well appearing patient in no apparent distress; mood and affect are within normal limits.  A full examination was performed including scalp, head, eyes, ears, nose, lips, neck, chest, axillae, abdomen, back, buttocks, bilateral upper extremities, bilateral lower extremities, hands, feet, fingers, toes, fingernails, and toenails. All findings within normal limits unless otherwise noted below.    Left Cheek Superior 4 mm tan flesh-colored papule     Left Cheek Inferior 6 mm pink papule    Assessment & Plan   EPIDERMAL INCLUSION CYST Exam: Subcutaneous nodule at right axilla  Benign-appearing. Exam most consistent with an epidermal inclusion cyst. Discussed that a cyst is a benign growth that can grow over time and sometimes get irritated or inflamed. Recommend observation if it is not bothersome. Discussed option of surgical excision to remove it if it is growing, symptomatic, or other changes noted. Please call for new or changing lesions so they can be evaluated.    Neoplasm of skin (2) Left Cheek Superior  Epidermal / dermal shaving  Lesion diameter (cm):  0.4 Informed consent: discussed and consent  obtained   Patient was prepped and draped in usual sterile fashion: Area prepped with alcohol. Anesthesia: the lesion was anesthetized in a standard fashion   Anesthetic:  1% lidocaine w/ epinephrine 1-100,000 buffered w/ 8.4% NaHCO3 Instrument used: flexible razor blade   Hemostasis achieved with: pressure, aluminum chloride and electrodesiccation   Outcome: patient tolerated procedure well   Post-procedure details: wound care instructions given   Post-procedure details comment:  Ointment and small bandage applied  Specimen 1 - Surgical pathology Differential Diagnosis: Irritated nevus, R/O atypia  Check Margins: No  Left Cheek Inferior  Epidermal / dermal shaving  Lesion diameter (cm):  0.6 Informed consent: discussed and consent obtained   Patient was prepped and draped in usual sterile fashion: Area prepped with alcohol. Anesthesia: the lesion was anesthetized in a standard fashion   Anesthetic:  1% lidocaine w/ epinephrine 1-100,000 buffered w/ 8.4% NaHCO3 Instrument used: flexible razor blade   Hemostasis achieved with: pressure, aluminum chloride and electrodesiccation   Outcome: patient tolerated procedure well   Post-procedure details: wound care instructions given   Post-procedure details comment:  Ointment and small bandage applied  Specimen 2 - Surgical pathology Differential Diagnosis: Irritated nevus, R/O atypia  Check Margins: No  Prior to shave removal reviewed expected scar and risk of dark spot appearing within the scar.    LENTIGINES, SEBORRHEIC KERATOSES, HEMANGIOMAS - Benign normal skin lesions - Benign-appearing - Call for any changes  MELANOCYTIC NEVI - Tan-brown and/or pink-flesh-colored symmetric macules and papules - Benign appearing on exam today - Observation - Call clinic for new or changing moles - Recommend daily use  of broad spectrum spf 30+ sunscreen to sun-exposed areas.   ACTINIC DAMAGE - Chronic condition, secondary to cumulative  UV/sun exposure - diffuse scaly erythematous macules with underlying dyspigmentation - Recommend daily broad spectrum sunscreen SPF 30+ to sun-exposed areas, reapply every 2 hours as needed.  - Staying in the shade or wearing long sleeves, sun glasses (UVA+UVB protection) and wide brim hats (4-inch brim around the entire circumference of the hat) are also recommended for sun protection.  - Call for new or changing lesions.  SKIN CANCER SCREENING PERFORMED TODAY   Return in about 1 year (around 08/08/2023) for TBSE.  I, Emelia Salisbury, CMA, am acting as scribe for Forest Gleason, MD.   Documentation: I have reviewed the above documentation for accuracy and completeness, and I agree with the above.  Forest Gleason, MD

## 2022-08-13 ENCOUNTER — Encounter: Payer: Self-pay | Admitting: Dermatology

## 2022-08-14 ENCOUNTER — Telehealth: Payer: Self-pay

## 2022-08-14 NOTE — Telephone Encounter (Signed)
-----   Message from Alfonso Patten, MD sent at 08/14/2022  9:45 AM EDT ----- 1. Skin , left cheek superior MELANOCYTIC NEVUS, INTRADERMAL TYPE 2. Skin , left cheek inferior MELANOCYTIC NEVUS, INTRADERMAL TYPE  This are NORMAL MOLES. No additional treatment is needed. If you notice any new or changing spots or have other skin concerns in future, please call our office at [phone].     MAs please call. Thank you!

## 2022-08-15 ENCOUNTER — Telehealth: Payer: Self-pay

## 2022-08-15 NOTE — Telephone Encounter (Signed)
Patient advised pathology showed benign normal moles. Lurlean Horns., RMA

## 2022-08-15 NOTE — Telephone Encounter (Signed)
-----   Message from Alfonso Patten, MD sent at 08/14/2022  9:45 AM EDT ----- 1. Skin , left cheek superior MELANOCYTIC NEVUS, INTRADERMAL TYPE 2. Skin , left cheek inferior MELANOCYTIC NEVUS, INTRADERMAL TYPE  This are NORMAL MOLES. No additional treatment is needed. If you notice any new or changing spots or have other skin concerns in future, please call our office at [phone].     MAs please call. Thank you!

## 2023-02-05 ENCOUNTER — Telehealth: Payer: Self-pay | Admitting: *Deleted

## 2023-02-05 DIAGNOSIS — E538 Deficiency of other specified B group vitamins: Secondary | ICD-10-CM

## 2023-02-05 DIAGNOSIS — D649 Anemia, unspecified: Secondary | ICD-10-CM

## 2023-02-05 DIAGNOSIS — R7303 Prediabetes: Secondary | ICD-10-CM

## 2023-02-05 NOTE — Telephone Encounter (Signed)
Last labs showed slight anemia and low B12 but there are no dx on list of these things.  Please link Dx or cancel lab test.

## 2023-02-05 NOTE — Telephone Encounter (Signed)
-----   Message from Lovena Neighbours sent at 02/05/2023  3:15 PM EDT ----- Regarding: Labs for Thursday 10.3.24 Please put physical lab orders in future. Thank you, Denny Peon

## 2023-02-05 NOTE — Telephone Encounter (Signed)
Labs linked and diagnosis' added.

## 2023-02-21 ENCOUNTER — Other Ambulatory Visit (INDEPENDENT_AMBULATORY_CARE_PROVIDER_SITE_OTHER): Payer: BC Managed Care – PPO

## 2023-02-21 DIAGNOSIS — E538 Deficiency of other specified B group vitamins: Secondary | ICD-10-CM | POA: Diagnosis not present

## 2023-02-21 DIAGNOSIS — R7303 Prediabetes: Secondary | ICD-10-CM | POA: Diagnosis not present

## 2023-02-21 DIAGNOSIS — D649 Anemia, unspecified: Secondary | ICD-10-CM

## 2023-02-21 LAB — COMPREHENSIVE METABOLIC PANEL
ALT: 10 U/L (ref 0–35)
AST: 14 U/L (ref 0–37)
Albumin: 4.4 g/dL (ref 3.5–5.2)
Alkaline Phosphatase: 53 U/L (ref 39–117)
BUN: 13 mg/dL (ref 6–23)
CO2: 27 meq/L (ref 19–32)
Calcium: 9.5 mg/dL (ref 8.4–10.5)
Chloride: 105 meq/L (ref 96–112)
Creatinine, Ser: 0.98 mg/dL (ref 0.40–1.20)
GFR: 70.63 mL/min (ref >60.00)
Glucose, Bld: 96 mg/dL (ref 70–99)
Potassium: 4.1 meq/L (ref 3.5–5.1)
Sodium: 139 meq/L (ref 135–145)
Total Bilirubin: 0.6 mg/dL (ref 0.2–1.2)
Total Protein: 6.6 g/dL (ref 6.0–8.3)

## 2023-02-21 LAB — CBC WITH DIFFERENTIAL/PLATELET
Basophils Absolute: 0.1 10*3/uL (ref 0.0–0.1)
Basophils Relative: 1 % (ref 0.0–3.0)
Eosinophils Absolute: 0.2 10*3/uL (ref 0.0–0.7)
Eosinophils Relative: 3.3 % (ref 0.0–5.0)
HCT: 44.9 % (ref 36.0–46.0)
Hemoglobin: 14.6 g/dL (ref 12.0–15.0)
Lymphocytes Relative: 26.3 % (ref 12.0–46.0)
Lymphs Abs: 1.5 10*3/uL (ref 0.7–4.0)
MCHC: 32.5 g/dL (ref 30.0–36.0)
MCV: 85.8 fL (ref 78.0–100.0)
Monocytes Absolute: 0.5 10*3/uL (ref 0.1–1.0)
Monocytes Relative: 8.2 % (ref 3.0–12.0)
Neutro Abs: 3.6 10*3/uL (ref 1.4–7.7)
Neutrophils Relative %: 61.2 % (ref 43.0–77.0)
Platelets: 282 10*3/uL (ref 150.0–400.0)
RBC: 5.23 Mil/uL — ABNORMAL HIGH (ref 3.87–5.11)
RDW: 14 % (ref 11.5–15.5)
WBC: 5.8 10*3/uL (ref 4.0–10.5)

## 2023-02-21 LAB — LIPID PANEL
Cholesterol: 232 mg/dL — ABNORMAL HIGH (ref 0–200)
HDL: 48.1 mg/dL (ref >39.00)
LDL Cholesterol: 152 mg/dL — ABNORMAL HIGH (ref 0–99)
NonHDL: 184.31
Total CHOL/HDL Ratio: 5
Triglycerides: 162 mg/dL — ABNORMAL HIGH (ref 0.0–149.0)
VLDL: 32.4 mg/dL (ref 0.0–40.0)

## 2023-02-21 LAB — HEMOGLOBIN A1C: Hgb A1c MFr Bld: 5.5 % (ref 4.6–6.5)

## 2023-02-21 LAB — VITAMIN B12: Vitamin B-12: 578 pg/mL (ref 211–911)

## 2023-02-21 NOTE — Progress Notes (Signed)
No critical labs need to be addressed urgently. We will discuss labs in detail at upcoming office visit.   

## 2023-02-28 ENCOUNTER — Ambulatory Visit (INDEPENDENT_AMBULATORY_CARE_PROVIDER_SITE_OTHER): Payer: BC Managed Care – PPO | Admitting: Family Medicine

## 2023-02-28 ENCOUNTER — Encounter: Payer: Self-pay | Admitting: Family Medicine

## 2023-02-28 ENCOUNTER — Other Ambulatory Visit: Payer: Self-pay | Admitting: Family Medicine

## 2023-02-28 ENCOUNTER — Ambulatory Visit
Admission: RE | Admit: 2023-02-28 | Discharge: 2023-02-28 | Disposition: A | Discharge status: Home / Self Care | Payer: BC Managed Care – PPO | Source: Ambulatory Visit | Attending: Family Medicine | Admitting: Family Medicine

## 2023-02-28 VITALS — BP 134/72 | HR 90 | Temp 98.3°F | Ht 64.0 in | Wt 204.6 lb

## 2023-02-28 DIAGNOSIS — Z1231 Encounter for screening mammogram for malignant neoplasm of breast: Secondary | ICD-10-CM

## 2023-02-28 DIAGNOSIS — M542 Cervicalgia: Secondary | ICD-10-CM | POA: Diagnosis not present

## 2023-02-28 DIAGNOSIS — G8929 Other chronic pain: Secondary | ICD-10-CM | POA: Diagnosis not present

## 2023-02-28 DIAGNOSIS — Z0000 Encounter for general adult medical examination without abnormal findings: Secondary | ICD-10-CM

## 2023-02-28 DIAGNOSIS — S0300XA Dislocation of jaw, unspecified side, initial encounter: Secondary | ICD-10-CM

## 2023-02-28 NOTE — Patient Instructions (Signed)
Work on low-cholesterol diet.

## 2023-02-28 NOTE — Progress Notes (Signed)
Patient ID: Patricia Baird, female    DOB: 29-Jul-1980, 42 y.o.   MRN: [id number]  This visit was conducted in person.  BP 134/72   Pulse 90   Temp 98.3 F (36.8 C)   Ht 5\' 4"  (1.626 m)   Wt 204 lb 9.6 oz (92.8 kg)   LMP 02/13/2023   SpO2 98%   BMI 35.12 kg/m    CC:  Chief Complaint  Patient presents with   Annual Exam    Subjective:   HPI: Patricia Baird is a 42 y.o. female presenting on 02/28/2023 for Annual Exam   Brother with brain bleed, CVA from HTN. Possible adrenal issue. She has been under more stress lately.  Pt doing well overall.   Occ twinge of pain in chest  Dentist requests referral to chiropractor for neck pain and TMJ.   Diet:  heart healthy  diet, occ fast food.  Exercise: walking 10K per day  Body mass index is 35.12 kg/m.  Wt Readings from Last 3 Encounters:  02/28/23 204 lb 9.6 oz (92.8 kg)  01/04/22 208 lb 8 oz (94.6 kg)  08/01/21 202 lb 12.8 oz (92 kg)    Prediabetes  Improved with diet changes Lab Results  Component Value Date   HGBA1C 5.5 02/21/2023    Reviewed labs in detail.  The 10-year ASCVD risk score (Arnett DK, et al., 2019) is: 1.3%   Values used to calculate the score:     Age: 62 years     Sex: Female     Is Non-Hispanic African American: No     Diabetic: No     Tobacco smoker: No     Systolic Blood Pressure: 134 mmHg     Is BP treated: No     HDL Cholesterol: 48.1 mg/dL     Total Cholesterol: 232 mg/dL     Relevant past medical, surgical, family and social history reviewed and updated as indicated. Interim medical history since our last visit reviewed. Allergies and medications reviewed and updated. Outpatient Medications Prior to Visit  Medication Sig Dispense Refill   acetaminophen (TYLENOL) 500 MG tablet Take 500 mg by mouth every 6 (six) hours as needed.     aspirin 325 MG tablet      cetirizine (ZYRTEC) 10 MG tablet Take 10 mg by mouth daily. Reported on 06/16/2015     ibuprofen (ADVIL) 200 MG  tablet      No facility-administered medications prior to visit.     Per HPI unless specifically indicated in ROS section below Review of Systems  Constitutional:  Negative for fatigue and fever.  HENT:  Negative for congestion.   Eyes:  Negative for pain.  Respiratory:  Negative for cough and shortness of breath.   Cardiovascular:  Negative for chest pain, palpitations and leg swelling.  Gastrointestinal:  Negative for abdominal pain.  Genitourinary:  Negative for dysuria and vaginal bleeding.  Musculoskeletal:  Negative for back pain.  Neurological:  Negative for syncope, light-headedness and headaches.  Psychiatric/Behavioral:  Negative for dysphoric mood.    Objective:  BP 134/72   Pulse 90   Temp 98.3 F (36.8 C)   Ht 5\' 4"  (1.626 m)   Wt 204 lb 9.6 oz (92.8 kg)   LMP 02/13/2023   SpO2 98%   BMI 35.12 kg/m   Wt Readings from Last 3 Encounters:  02/28/23 204 lb 9.6 oz (92.8 kg)  01/04/22 208 lb 8 oz (94.6 kg)  08/01/21 202 lb 12.8 oz (92  kg)      Physical Exam Vitals and nursing note reviewed.  Constitutional:      General: She is not in acute distress.    Appearance: Normal appearance. She is well-developed. She is not ill-appearing or toxic-appearing.  HENT:     Head: Normocephalic.     Right Ear: Hearing, tympanic membrane, ear canal and external ear normal.     Left Ear: Hearing, tympanic membrane, ear canal and external ear normal.     Nose: Nose normal.  Eyes:     General: Lids are normal. Lids are everted, no foreign bodies appreciated.     Conjunctiva/sclera: Conjunctivae normal.     Pupils: Pupils are equal, round, and reactive to light.  Neck:     Thyroid: No thyroid mass or thyromegaly.     Vascular: No carotid bruit.     Trachea: Trachea normal.  Cardiovascular:     Rate and Rhythm: Normal rate and regular rhythm.     Heart sounds: Normal heart sounds, S1 normal and S2 normal. No murmur heard.    No gallop.  Pulmonary:     Effort: Pulmonary  effort is normal. No respiratory distress.     Breath sounds: Normal breath sounds. No wheezing, rhonchi or rales.  Abdominal:     General: Bowel sounds are normal. There is no distension or abdominal bruit.     Palpations: Abdomen is soft. There is no fluid wave or mass.     Tenderness: There is no abdominal tenderness. There is no guarding or rebound.     Hernia: No hernia is present.  Musculoskeletal:     Cervical back: Normal range of motion and neck supple.  Lymphadenopathy:     Cervical: No cervical adenopathy.  Skin:    General: Skin is warm and dry.     Findings: No rash.  Neurological:     Mental Status: She is alert.     Cranial Nerves: No cranial nerve deficit.     Sensory: No sensory deficit.  Psychiatric:        Mood and Affect: Mood is not anxious or depressed.        Speech: Speech normal.        Behavior: Behavior normal. Behavior is cooperative.        Judgment: Judgment normal.       Results for orders placed or performed in visit on 02/21/23  Vitamin B12  Result Value Ref Range   Vitamin B-12 578 211 - 911 pg/mL  CBC with Differential/Platelet  Result Value Ref Range   WBC 5.8 4.0 - 10.5 K/uL   RBC 5.23 (H) 3.87 - 5.11 Mil/uL   Hemoglobin 14.6 12.0 - 15.0 g/dL   HCT 95.2 84.1 - 32.4 %   MCV 85.8 78.0 - 100.0 fl   MCHC 32.5 30.0 - 36.0 g/dL   RDW 40.1 02.7 - 25.3 %   Platelets 282.0 150.0 - 400.0 K/uL   Neutrophils Relative % 61.2 43.0 - 77.0 %   Lymphocytes Relative 26.3 12.0 - 46.0 %   Monocytes Relative 8.2 3.0 - 12.0 %   Eosinophils Relative 3.3 0.0 - 5.0 %   Basophils Relative 1.0 0.0 - 3.0 %   Neutro Abs 3.6 1.4 - 7.7 K/uL   Lymphs Abs 1.5 0.7 - 4.0 K/uL   Monocytes Absolute 0.5 0.1 - 1.0 K/uL   Eosinophils Absolute 0.2 0.0 - 0.7 K/uL   Basophils Absolute 0.1 0.0 - 0.1 K/uL  Lipid panel  Result  Value Ref Range   Cholesterol 232 (H) 0 - 200 mg/dL   Triglycerides 098.1 (H) 0.0 - 149.0 mg/dL   HDL 19.14 >78.29 mg/dL   VLDL 56.2 0.0 - 13.0  mg/dL   LDL Cholesterol 865 (H) 0 - 99 mg/dL   Total CHOL/HDL Ratio 5    NonHDL 184.31   Hemoglobin A1c  Result Value Ref Range   Hgb A1c MFr Bld 5.5 4.6 - 6.5 %  Comprehensive metabolic panel  Result Value Ref Range   Sodium 139 135 - 145 mEq/L   Potassium 4.1 3.5 - 5.1 mEq/L   Chloride 105 96 - 112 mEq/L   CO2 27 19 - 32 mEq/L   Glucose, Bld 96 70 - 99 mg/dL   BUN 13 6 - 23 mg/dL   Creatinine, Ser 7.84 0.40 - 1.20 mg/dL   Total Bilirubin 0.6 0.2 - 1.2 mg/dL   Alkaline Phosphatase 53 39 - 117 U/L   AST 14 0 - 37 U/L   ALT 10 0 - 35 U/L   Total Protein 6.6 6.0 - 8.3 g/dL   Albumin 4.4 3.5 - 5.2 g/dL   GFR 69.62 >95.28 mL/min   Calcium 9.5 8.4 - 10.5 mg/dL    This visit occurred during the SARS-CoV-2 public health emergency.  Safety protocols were in place, including screening questions prior to the visit, additional usage of staff PPE, and extensive cleaning of exam room while observing appropriate contact time as indicated for disinfecting solutions.   COVID 19 screen:  No recent travel or known exposure to COVID19 The patient denies respiratory symptoms of COVID 19 at this time. The importance of social distancing was discussed today.   Assessment and Plan The patient's preventative maintenance and recommended screening tests for an annual wellness exam were reviewed in full today. Brought up to date unless services declined.  Counselled on the importance of diet, exercise, and its role in overall health and mortality. The patient's FH and SH was reviewed, including their home life, tobacco status, and drug and alcohol status.   Vaccines: uptodate tdap,  Refuses flu vaccine. S/P 2 COVID vaccines Pap/DVE: 07/2020 nml pap, neg HPV.Marland Kitchen Repeat in 5 years MGM ovarian cancer age 10, no clear indication for DVE. No early family history of breast cancer or colon cancer. Mammogram: 07/2021.Marland Kitchen due Smoking Status: none ETOH/ drug use: none/none HIV screen:    completed.  Hep c:   done  No early family history of colon cancer.   Kerby Nora, MD

## 2023-03-01 ENCOUNTER — Encounter: Payer: Self-pay | Admitting: *Deleted

## 2023-03-21 DIAGNOSIS — R519 Headache, unspecified: Secondary | ICD-10-CM | POA: Diagnosis not present

## 2023-03-21 DIAGNOSIS — M9901 Segmental and somatic dysfunction of cervical region: Secondary | ICD-10-CM | POA: Diagnosis not present

## 2023-03-21 DIAGNOSIS — M5412 Radiculopathy, cervical region: Secondary | ICD-10-CM | POA: Diagnosis not present

## 2023-03-21 DIAGNOSIS — M9902 Segmental and somatic dysfunction of thoracic region: Secondary | ICD-10-CM | POA: Diagnosis not present

## 2023-03-22 DIAGNOSIS — M9901 Segmental and somatic dysfunction of cervical region: Secondary | ICD-10-CM | POA: Diagnosis not present

## 2023-03-22 DIAGNOSIS — M5412 Radiculopathy, cervical region: Secondary | ICD-10-CM | POA: Diagnosis not present

## 2023-03-22 DIAGNOSIS — M9902 Segmental and somatic dysfunction of thoracic region: Secondary | ICD-10-CM | POA: Diagnosis not present

## 2023-03-22 DIAGNOSIS — R519 Headache, unspecified: Secondary | ICD-10-CM | POA: Diagnosis not present

## 2023-03-25 DIAGNOSIS — M5412 Radiculopathy, cervical region: Secondary | ICD-10-CM | POA: Diagnosis not present

## 2023-03-25 DIAGNOSIS — M9901 Segmental and somatic dysfunction of cervical region: Secondary | ICD-10-CM | POA: Diagnosis not present

## 2023-03-25 DIAGNOSIS — R519 Headache, unspecified: Secondary | ICD-10-CM | POA: Diagnosis not present

## 2023-03-25 DIAGNOSIS — M9902 Segmental and somatic dysfunction of thoracic region: Secondary | ICD-10-CM | POA: Diagnosis not present

## 2023-03-26 DIAGNOSIS — R519 Headache, unspecified: Secondary | ICD-10-CM | POA: Diagnosis not present

## 2023-03-26 DIAGNOSIS — M9902 Segmental and somatic dysfunction of thoracic region: Secondary | ICD-10-CM | POA: Diagnosis not present

## 2023-03-26 DIAGNOSIS — M9901 Segmental and somatic dysfunction of cervical region: Secondary | ICD-10-CM | POA: Diagnosis not present

## 2023-03-26 DIAGNOSIS — M5412 Radiculopathy, cervical region: Secondary | ICD-10-CM | POA: Diagnosis not present

## 2023-03-29 DIAGNOSIS — R519 Headache, unspecified: Secondary | ICD-10-CM | POA: Diagnosis not present

## 2023-03-29 DIAGNOSIS — M9901 Segmental and somatic dysfunction of cervical region: Secondary | ICD-10-CM | POA: Diagnosis not present

## 2023-03-29 DIAGNOSIS — M9902 Segmental and somatic dysfunction of thoracic region: Secondary | ICD-10-CM | POA: Diagnosis not present

## 2023-03-29 DIAGNOSIS — M5412 Radiculopathy, cervical region: Secondary | ICD-10-CM | POA: Diagnosis not present

## 2023-04-02 DIAGNOSIS — M5412 Radiculopathy, cervical region: Secondary | ICD-10-CM | POA: Diagnosis not present

## 2023-04-02 DIAGNOSIS — M9902 Segmental and somatic dysfunction of thoracic region: Secondary | ICD-10-CM | POA: Diagnosis not present

## 2023-04-02 DIAGNOSIS — M9901 Segmental and somatic dysfunction of cervical region: Secondary | ICD-10-CM | POA: Diagnosis not present

## 2023-04-02 DIAGNOSIS — R519 Headache, unspecified: Secondary | ICD-10-CM | POA: Diagnosis not present

## 2023-04-03 DIAGNOSIS — M9901 Segmental and somatic dysfunction of cervical region: Secondary | ICD-10-CM | POA: Diagnosis not present

## 2023-04-03 DIAGNOSIS — R519 Headache, unspecified: Secondary | ICD-10-CM | POA: Diagnosis not present

## 2023-04-03 DIAGNOSIS — M5412 Radiculopathy, cervical region: Secondary | ICD-10-CM | POA: Diagnosis not present

## 2023-04-03 DIAGNOSIS — M9902 Segmental and somatic dysfunction of thoracic region: Secondary | ICD-10-CM | POA: Diagnosis not present

## 2023-04-05 DIAGNOSIS — M5412 Radiculopathy, cervical region: Secondary | ICD-10-CM | POA: Diagnosis not present

## 2023-04-05 DIAGNOSIS — M9902 Segmental and somatic dysfunction of thoracic region: Secondary | ICD-10-CM | POA: Diagnosis not present

## 2023-04-05 DIAGNOSIS — R519 Headache, unspecified: Secondary | ICD-10-CM | POA: Diagnosis not present

## 2023-04-05 DIAGNOSIS — M9901 Segmental and somatic dysfunction of cervical region: Secondary | ICD-10-CM | POA: Diagnosis not present

## 2023-04-08 DIAGNOSIS — M9901 Segmental and somatic dysfunction of cervical region: Secondary | ICD-10-CM | POA: Diagnosis not present

## 2023-04-08 DIAGNOSIS — R519 Headache, unspecified: Secondary | ICD-10-CM | POA: Diagnosis not present

## 2023-04-08 DIAGNOSIS — M5412 Radiculopathy, cervical region: Secondary | ICD-10-CM | POA: Diagnosis not present

## 2023-04-08 DIAGNOSIS — M9902 Segmental and somatic dysfunction of thoracic region: Secondary | ICD-10-CM | POA: Diagnosis not present

## 2023-04-09 DIAGNOSIS — M9901 Segmental and somatic dysfunction of cervical region: Secondary | ICD-10-CM | POA: Diagnosis not present

## 2023-04-09 DIAGNOSIS — M5412 Radiculopathy, cervical region: Secondary | ICD-10-CM | POA: Diagnosis not present

## 2023-04-09 DIAGNOSIS — M9902 Segmental and somatic dysfunction of thoracic region: Secondary | ICD-10-CM | POA: Diagnosis not present

## 2023-04-09 DIAGNOSIS — R519 Headache, unspecified: Secondary | ICD-10-CM | POA: Diagnosis not present

## 2023-04-12 DIAGNOSIS — M9901 Segmental and somatic dysfunction of cervical region: Secondary | ICD-10-CM | POA: Diagnosis not present

## 2023-04-12 DIAGNOSIS — R519 Headache, unspecified: Secondary | ICD-10-CM | POA: Diagnosis not present

## 2023-04-12 DIAGNOSIS — M9902 Segmental and somatic dysfunction of thoracic region: Secondary | ICD-10-CM | POA: Diagnosis not present

## 2023-04-12 DIAGNOSIS — M5412 Radiculopathy, cervical region: Secondary | ICD-10-CM | POA: Diagnosis not present

## 2023-04-16 DIAGNOSIS — M9901 Segmental and somatic dysfunction of cervical region: Secondary | ICD-10-CM | POA: Diagnosis not present

## 2023-04-16 DIAGNOSIS — R519 Headache, unspecified: Secondary | ICD-10-CM | POA: Diagnosis not present

## 2023-04-16 DIAGNOSIS — M9902 Segmental and somatic dysfunction of thoracic region: Secondary | ICD-10-CM | POA: Diagnosis not present

## 2023-04-16 DIAGNOSIS — M5412 Radiculopathy, cervical region: Secondary | ICD-10-CM | POA: Diagnosis not present

## 2023-04-25 ENCOUNTER — Ambulatory Visit: Payer: BC Managed Care – PPO | Admitting: Family Medicine

## 2023-04-30 ENCOUNTER — Ambulatory Visit: Payer: BC Managed Care – PPO | Admitting: Family Medicine

## 2023-04-30 ENCOUNTER — Ambulatory Visit
Admission: RE | Admit: 2023-04-30 | Discharge: 2023-04-30 | Disposition: A | Discharge status: Home / Self Care | Payer: BC Managed Care – PPO | Source: Ambulatory Visit | Attending: Family Medicine | Admitting: Family Medicine

## 2023-04-30 ENCOUNTER — Encounter: Payer: Self-pay | Admitting: Family Medicine

## 2023-04-30 VITALS — BP 140/80 | HR 106 | Temp 98.6°F | Ht 64.0 in | Wt 198.2 lb

## 2023-04-30 DIAGNOSIS — M549 Dorsalgia, unspecified: Secondary | ICD-10-CM | POA: Insufficient documentation

## 2023-04-30 DIAGNOSIS — R1011 Right upper quadrant pain: Secondary | ICD-10-CM

## 2023-04-30 DIAGNOSIS — M546 Pain in thoracic spine: Secondary | ICD-10-CM | POA: Diagnosis not present

## 2023-04-30 DIAGNOSIS — M47814 Spondylosis without myelopathy or radiculopathy, thoracic region: Secondary | ICD-10-CM | POA: Diagnosis not present

## 2023-04-30 NOTE — Assessment & Plan Note (Signed)
Persistent right upper quadrant pain over lower rib cage ongoing greater than 2 years, intermittently.  Now with new associated thoracic pain.  Will reevaluate with labs and right upper quadrant ultrasound with plan on moving forward with HIDA scan

## 2023-04-30 NOTE — Assessment & Plan Note (Addendum)
Acute, new symptom Possibly associated with right upper quadrant chronic pain versus additional issue. Will evaluate with thoracic spine film for possible referred pain from thoracic spine given patient associates increased pain with movement. No rash. Can also consider evaluation for kidney stones/ no symptoms of UTI/pyelo

## 2023-04-30 NOTE — Progress Notes (Signed)
Patient ID: Jealousy Rashed, female    DOB: Dec 04, 1980, 42 y.o.   MRN: [id number]  This visit was conducted in person.  BP (!) 140/80 (BP Location: Right Arm, Patient Position: Sitting, Cuff Size: Large)   Pulse (!) 106   Temp 98.6 F (37 C) (Oral)   Ht 5\' 4"  (1.626 m)   Wt 198 lb 4 oz (89.9 kg)   LMP 04/07/2023   SpO2 99%   BMI 34.03 kg/m    CC:  Chief Complaint  Patient presents with   RUQ Pain    Subjective:   HPI: Melisande Devenny is a 42 y.o. female presenting on 04/30/2023 for RUQ Pain   She has had recurrent intermittent pain in RUQ since 2021... nting sore ness in right lower rib cage few times a week, mild.   Now in last 2 weeks worsening of pain in right UQ and pain in right  mid back... severe.. kept her up at night for a few days.  No Nausea, no vomiting.  No dysuria, no urgency, no hematuria.  Seems to be worse after eating fast food.   2022 [place] RUQ : nml  2023  rib film negative.   02/2023 cbc, nml liver function     Some pain with movement or with massage at chiropractor.. no recent falls, no change in activity.  Relevant past medical, surgical, family and social history reviewed and updated as indicated. Interim medical history since our last visit reviewed. Allergies and medications reviewed and updated. Outpatient Medications Prior to Visit  Medication Sig Dispense Refill   acetaminophen (TYLENOL) 500 MG tablet Take 500 mg by mouth every 6 (six) hours as needed.     aspirin 325 MG tablet Take 325 mg by mouth daily as needed.     ibuprofen (ADVIL) 200 MG tablet Take 400 mg by mouth every 6 (six) hours as needed.     Loratadine 10 MG CAPS Take 1 capsule by mouth daily.     cetirizine (ZYRTEC) 10 MG tablet Take 10 mg by mouth daily. Reported on 06/16/2015     No facility-administered medications prior to visit.     Per HPI unless specifically indicated in ROS section below Review of Systems  Constitutional:  Negative for fatigue and fever.   HENT:  Negative for congestion.   Eyes:  Negative for pain.  Respiratory:  Negative for cough and shortness of breath.   Cardiovascular:  Negative for chest pain, palpitations and leg swelling.  Gastrointestinal:  Positive for abdominal pain. Negative for blood in stool, constipation, diarrhea, nausea and vomiting.  Genitourinary:  Negative for dysuria and vaginal bleeding.  Musculoskeletal:  Positive for back pain.  Neurological:  Negative for syncope, light-headedness and headaches.  Psychiatric/Behavioral:  Negative for dysphoric mood.    Objective:  BP (!) 140/80 (BP Location: Right Arm, Patient Position: Sitting, Cuff Size: Large)   Pulse (!) 106   Temp 98.6 F (37 C) (Oral)   Ht 5\' 4"  (1.626 m)   Wt 198 lb 4 oz (89.9 kg)   LMP 04/07/2023   SpO2 99%   BMI 34.03 kg/m   Wt Readings from Last 3 Encounters:  04/30/23 198 lb 4 oz (89.9 kg)  02/28/23 204 lb 9.6 oz (92.8 kg)  01/04/22 208 lb 8 oz (94.6 kg)      Physical Exam Constitutional:      General: She is not in acute distress.    Appearance: Normal appearance. She is well-developed. She is not ill-appearing  or toxic-appearing.  HENT:     Head: Normocephalic.     Right Ear: Hearing, tympanic membrane, ear canal and external ear normal. Tympanic membrane is not erythematous, retracted or bulging.     Left Ear: Hearing, tympanic membrane, ear canal and external ear normal. Tympanic membrane is not erythematous, retracted or bulging.     Nose: No mucosal edema or rhinorrhea.     Right Sinus: No maxillary sinus tenderness or frontal sinus tenderness.     Left Sinus: No maxillary sinus tenderness or frontal sinus tenderness.     Mouth/Throat:     Mouth: Oropharynx is clear and moist and mucous membranes are normal.     Pharynx: Uvula midline.  Eyes:     General: Lids are normal. Lids are everted, no foreign bodies appreciated.     Extraocular Movements: EOM normal.     Conjunctiva/sclera: Conjunctivae normal.     Pupils:  Pupils are equal, round, and reactive to light.  Neck:     Thyroid: No thyroid mass or thyromegaly.     Vascular: No carotid bruit.     Trachea: Trachea normal.  Cardiovascular:     Rate and Rhythm: Normal rate and regular rhythm.     Pulses: Normal pulses.     Heart sounds: Normal heart sounds, S1 normal and S2 normal. No murmur heard.    No friction rub. No gallop.  Pulmonary:     Effort: Pulmonary effort is normal. No tachypnea or respiratory distress.     Breath sounds: Normal breath sounds. No decreased breath sounds, wheezing, rhonchi or rales.  Abdominal:     General: Bowel sounds are normal.     Palpations: Abdomen is soft.     Tenderness: There is abdominal tenderness in the right upper quadrant. There is no right CVA tenderness.  Musculoskeletal:     Cervical back: Normal range of motion and neck supple.     Thoracic back: Tenderness present. No spasms or bony tenderness. Normal range of motion.  Skin:    General: Skin is warm, dry and intact.     Findings: No rash.  Neurological:     Mental Status: She is alert.  Psychiatric:        Mood and Affect: Mood is not anxious or depressed.        Speech: Speech normal.        Behavior: Behavior normal. Behavior is cooperative.        Thought Content: Thought content normal.        Cognition and Memory: Cognition and memory normal.        Judgment: Judgment normal.       Results for orders placed or performed in visit on 02/21/23  Vitamin B12  Result Value Ref Range   Vitamin B-12 578 211 - 911 pg/mL  CBC with Differential/Platelet  Result Value Ref Range   WBC 5.8 4.0 - 10.5 K/uL   RBC 5.23 (H) 3.87 - 5.11 Mil/uL   Hemoglobin 14.6 12.0 - 15.0 g/dL   HCT 11.9 14.7 - 82.9 %   MCV 85.8 78.0 - 100.0 fl   MCHC 32.5 30.0 - 36.0 g/dL   RDW 56.2 13.0 - 86.5 %   Platelets 282.0 150.0 - 400.0 K/uL   Neutrophils Relative % 61.2 43.0 - 77.0 %   Lymphocytes Relative 26.3 12.0 - 46.0 %   Monocytes Relative 8.2 3.0 - 12.0 %    Eosinophils Relative 3.3 0.0 - 5.0 %   Basophils Relative  1.0 0.0 - 3.0 %   Neutro Abs 3.6 1.4 - 7.7 K/uL   Lymphs Abs 1.5 0.7 - 4.0 K/uL   Monocytes Absolute 0.5 0.1 - 1.0 K/uL   Eosinophils Absolute 0.2 0.0 - 0.7 K/uL   Basophils Absolute 0.1 0.0 - 0.1 K/uL  Lipid panel  Result Value Ref Range   Cholesterol 232 (H) 0 - 200 mg/dL   Triglycerides 756.4 (H) 0.0 - 149.0 mg/dL   HDL 33.29 >51.88 mg/dL   VLDL 41.6 0.0 - 60.6 mg/dL   LDL Cholesterol 301 (H) 0 - 99 mg/dL   Total CHOL/HDL Ratio 5    NonHDL 184.31   Hemoglobin A1c  Result Value Ref Range   Hgb A1c MFr Bld 5.5 4.6 - 6.5 %  Comprehensive metabolic panel  Result Value Ref Range   Sodium 139 135 - 145 mEq/L   Potassium 4.1 3.5 - 5.1 mEq/L   Chloride 105 96 - 112 mEq/L   CO2 27 19 - 32 mEq/L   Glucose, Bld 96 70 - 99 mg/dL   BUN 13 6 - 23 mg/dL   Creatinine, Ser 6.01 0.40 - 1.20 mg/dL   Total Bilirubin 0.6 0.2 - 1.2 mg/dL   Alkaline Phosphatase 53 39 - 117 U/L   AST 14 0 - 37 U/L   ALT 10 0 - 35 U/L   Total Protein 6.6 6.0 - 8.3 g/dL   Albumin 4.4 3.5 - 5.2 g/dL   GFR 09.32 >35.57 mL/min   Calcium 9.5 8.4 - 10.5 mg/dL    Assessment and Plan  RUQ pain Assessment & Plan: Persistent right upper quadrant pain over lower rib cage ongoing greater than 2 years, intermittently.  Now with new associated thoracic pain.  Will reevaluate with labs and right upper quadrant ultrasound with plan on moving forward with HIDA scan  Orders: -     Comprehensive metabolic panel -     CBC with Differential/Platelet -     Lipase -     US ABDOMEN LIMITED RUQ (LIVER/GB); Future -     DG Thoracic Spine W/Swimmers; Future  Mid back pain on right side Assessment & Plan: Acute, new symptom Possibly associated with right upper quadrant chronic pain versus additional issue. Will evaluate with thoracic spine film for possible referred pain from thoracic spine given patient associates increased pain with movement. No rash. Can also  consider evaluation for kidney stones/ no symptoms of UTI/pyelo  Orders: -     DG Thoracic Spine W/Swimmers; Future    No follow-ups on file.   Kerby Nora, MD

## 2023-05-01 ENCOUNTER — Encounter: Payer: Self-pay | Admitting: *Deleted

## 2023-05-01 LAB — CBC WITH DIFFERENTIAL/PLATELET
Basophils Absolute: 0.1 10*3/uL (ref 0.0–0.1)
Basophils Relative: 1 % (ref 0.0–3.0)
Eosinophils Absolute: 0.1 10*3/uL (ref 0.0–0.7)
Eosinophils Relative: 1.3 % (ref 0.0–5.0)
HCT: 43.3 % (ref 36.0–46.0)
Hemoglobin: 14.5 g/dL (ref 12.0–15.0)
Lymphocytes Relative: 20.8 % (ref 12.0–46.0)
Lymphs Abs: 1.6 10*3/uL (ref 0.7–4.0)
MCHC: 33.5 g/dL (ref 30.0–36.0)
MCV: 85.9 fL (ref 78.0–100.0)
Monocytes Absolute: 0.6 10*3/uL (ref 0.1–1.0)
Monocytes Relative: 8.2 % (ref 3.0–12.0)
Neutro Abs: 5.1 10*3/uL (ref 1.4–7.7)
Neutrophils Relative %: 68.7 % (ref 43.0–77.0)
Platelets: 276 10*3/uL (ref 150.0–400.0)
RBC: 5.03 Mil/uL (ref 3.87–5.11)
RDW: 14 % (ref 11.5–15.5)
WBC: 7.5 10*3/uL (ref 4.0–10.5)

## 2023-05-01 LAB — COMPREHENSIVE METABOLIC PANEL
ALT: 9 U/L (ref 0–35)
AST: 12 U/L (ref 0–37)
Albumin: 4.6 g/dL (ref 3.5–5.2)
Alkaline Phosphatase: 44 U/L (ref 39–117)
BUN: 18 mg/dL (ref 6–23)
CO2: 25 meq/L (ref 19–32)
Calcium: 9.3 mg/dL (ref 8.4–10.5)
Chloride: 104 meq/L (ref 96–112)
Creatinine, Ser: 0.89 mg/dL (ref 0.40–1.20)
GFR: 79.18 mL/min (ref >60.00)
Glucose, Bld: 102 mg/dL — ABNORMAL HIGH (ref 70–99)
Potassium: 4 meq/L (ref 3.5–5.1)
Sodium: 138 meq/L (ref 135–145)
Total Bilirubin: 0.6 mg/dL (ref 0.2–1.2)
Total Protein: 7.2 g/dL (ref 6.0–8.3)

## 2023-05-01 LAB — LIPASE: Lipase: 22 U/L (ref 11.0–59.0)

## 2023-05-02 ENCOUNTER — Encounter: Payer: Self-pay | Admitting: Family Medicine

## 2023-05-07 DIAGNOSIS — M9901 Segmental and somatic dysfunction of cervical region: Secondary | ICD-10-CM | POA: Diagnosis not present

## 2023-05-07 DIAGNOSIS — M9902 Segmental and somatic dysfunction of thoracic region: Secondary | ICD-10-CM | POA: Diagnosis not present

## 2023-05-07 DIAGNOSIS — M5412 Radiculopathy, cervical region: Secondary | ICD-10-CM | POA: Diagnosis not present

## 2023-05-07 DIAGNOSIS — R519 Headache, unspecified: Secondary | ICD-10-CM | POA: Diagnosis not present

## 2023-05-08 NOTE — Telephone Encounter (Signed)
Note from Dr. Cherre Huger placed in Dr. Daphine Deutscher office in box to review.

## 2023-05-08 NOTE — Telephone Encounter (Signed)
[facility] sent over a email for pt, with their most recent findings. Dr. Cherre Huger has requested the pt get evaluated by Dr. Ermalene Searing & also requested a referral from Dr. Ermalene Searing for a hosp radiology for eval too? Ppw is in Dr. Daphine Deutscher folder.

## 2023-05-09 ENCOUNTER — Ambulatory Visit
Admission: RE | Admit: 2023-05-09 | Discharge: 2023-05-09 | Disposition: A | Discharge status: Home / Self Care | Payer: BC Managed Care – PPO | Source: Ambulatory Visit | Attending: Family Medicine | Admitting: Family Medicine

## 2023-05-09 ENCOUNTER — Ambulatory Visit: Payer: BC Managed Care – PPO

## 2023-05-09 ENCOUNTER — Encounter: Payer: Self-pay | Admitting: Family Medicine

## 2023-05-09 DIAGNOSIS — R1011 Right upper quadrant pain: Secondary | ICD-10-CM | POA: Insufficient documentation

## 2023-05-09 DIAGNOSIS — K824 Cholesterolosis of gallbladder: Secondary | ICD-10-CM | POA: Diagnosis not present

## 2023-05-09 DIAGNOSIS — M2669 Other specified disorders of temporomandibular joint: Secondary | ICD-10-CM

## 2023-05-09 DIAGNOSIS — M26623 Arthralgia of bilateral temporomandibular joint: Secondary | ICD-10-CM

## 2023-05-09 DIAGNOSIS — R6884 Jaw pain: Secondary | ICD-10-CM

## 2023-05-13 DIAGNOSIS — M9902 Segmental and somatic dysfunction of thoracic region: Secondary | ICD-10-CM | POA: Diagnosis not present

## 2023-05-13 DIAGNOSIS — M9901 Segmental and somatic dysfunction of cervical region: Secondary | ICD-10-CM | POA: Diagnosis not present

## 2023-05-13 DIAGNOSIS — M5412 Radiculopathy, cervical region: Secondary | ICD-10-CM | POA: Diagnosis not present

## 2023-05-13 DIAGNOSIS — R519 Headache, unspecified: Secondary | ICD-10-CM | POA: Diagnosis not present

## 2023-05-14 ENCOUNTER — Other Ambulatory Visit: Payer: Self-pay | Admitting: Family Medicine

## 2023-05-14 DIAGNOSIS — R1011 Right upper quadrant pain: Secondary | ICD-10-CM

## 2023-05-14 NOTE — Telephone Encounter (Signed)
I have left message and also sent fax to [facility] without any return call or fax.   FYI to Dr. Ermalene Searing.

## 2023-05-21 ENCOUNTER — Encounter
Admission: RE | Admit: 2023-05-21 | Discharge: 2023-05-21 | Disposition: A | Discharge status: Home / Self Care | Payer: BC Managed Care – PPO | Source: Ambulatory Visit | Attending: Family Medicine | Admitting: Family Medicine

## 2023-05-21 DIAGNOSIS — R1011 Right upper quadrant pain: Secondary | ICD-10-CM | POA: Insufficient documentation

## 2023-05-21 MED ORDER — TECHNETIUM TC 99M MEBROFENIN IV KIT
5.0000 | PACK | Freq: Once | INTRAVENOUS | Status: AC | PRN
  Administered 2023-05-21: 4.98 via INTRAVENOUS

## 2023-05-24 ENCOUNTER — Encounter: Payer: Self-pay | Admitting: *Deleted

## 2023-05-31 ENCOUNTER — Ambulatory Visit: Payer: Self-pay

## 2023-06-04 NOTE — Telephone Encounter (Signed)
 I spoke with the patient on the phone and explained the "GI" is just the abbreviation for [facility] and her scan is still the CT Maxillofacial.   Nothing further needed.

## 2023-06-05 ENCOUNTER — Ambulatory Visit
Admission: RE | Admit: 2023-06-05 | Discharge: 2023-06-05 | Disposition: A | Discharge status: Home / Self Care | Payer: Managed Care, Other (non HMO) | Source: Ambulatory Visit | Attending: Family Medicine | Admitting: Family Medicine

## 2023-06-05 DIAGNOSIS — R6884 Jaw pain: Secondary | ICD-10-CM

## 2023-06-05 DIAGNOSIS — M2669 Other specified disorders of temporomandibular joint: Secondary | ICD-10-CM

## 2023-06-05 DIAGNOSIS — M26623 Arthralgia of bilateral temporomandibular joint: Secondary | ICD-10-CM

## 2023-06-06 ENCOUNTER — Ambulatory Visit: Payer: Managed Care, Other (non HMO)

## 2023-06-07 ENCOUNTER — Ambulatory Visit: Payer: Managed Care, Other (non HMO)

## 2024-02-20 ENCOUNTER — Telehealth: Payer: Self-pay | Admitting: *Deleted

## 2024-02-20 DIAGNOSIS — E538 Deficiency of other specified B group vitamins: Secondary | ICD-10-CM

## 2024-02-20 DIAGNOSIS — R7303 Prediabetes: Secondary | ICD-10-CM

## 2024-02-20 DIAGNOSIS — D649 Anemia, unspecified: Secondary | ICD-10-CM

## 2024-02-20 NOTE — Telephone Encounter (Signed)
-----   Message from Harlene Du sent at 02/20/2024  1:42 PM EDT ----- Regarding: Lab Tues 02/25/24 Hello,  Patient is coming in for CPE labs on Tuesday 02/20/24. Can we get orders please.   Thanks

## 2024-02-25 ENCOUNTER — Ambulatory Visit: Payer: Self-pay | Admitting: Family Medicine

## 2024-02-25 ENCOUNTER — Other Ambulatory Visit (INDEPENDENT_AMBULATORY_CARE_PROVIDER_SITE_OTHER)

## 2024-02-25 DIAGNOSIS — R7303 Prediabetes: Secondary | ICD-10-CM

## 2024-02-25 DIAGNOSIS — D649 Anemia, unspecified: Secondary | ICD-10-CM | POA: Diagnosis not present

## 2024-02-25 DIAGNOSIS — E538 Deficiency of other specified B group vitamins: Secondary | ICD-10-CM

## 2024-02-25 LAB — CBC WITH DIFFERENTIAL/PLATELET
Basophils Absolute: 0 K/uL (ref 0.0–0.1)
Basophils Relative: 0.6 % (ref 0.0–3.0)
Eosinophils Absolute: 0.2 K/uL (ref 0.0–0.7)
Eosinophils Relative: 3.8 % (ref 0.0–5.0)
HCT: 42.1 % (ref 36.0–46.0)
Hemoglobin: 13.8 g/dL (ref 12.0–15.0)
Lymphocytes Relative: 27.7 % (ref 12.0–46.0)
Lymphs Abs: 1.6 K/uL (ref 0.7–4.0)
MCHC: 32.7 g/dL (ref 30.0–36.0)
MCV: 79.5 fl (ref 78.0–100.0)
Monocytes Absolute: 0.4 K/uL (ref 0.1–1.0)
Monocytes Relative: 6.9 % (ref 3.0–12.0)
Neutro Abs: 3.6 K/uL (ref 1.4–7.7)
Neutrophils Relative %: 61 % (ref 43.0–77.0)
Platelets: 299 K/uL (ref 150.0–400.0)
RBC: 5.3 Mil/uL — ABNORMAL HIGH (ref 3.87–5.11)
RDW: 15.7 % — ABNORMAL HIGH (ref 11.5–15.5)
WBC: 5.9 K/uL (ref 4.0–10.5)

## 2024-02-25 LAB — COMPREHENSIVE METABOLIC PANEL WITH GFR
ALT: 11 U/L (ref 0–35)
AST: 16 U/L (ref 0–37)
Albumin: 4.5 g/dL (ref 3.5–5.2)
Alkaline Phosphatase: 49 U/L (ref 39–117)
BUN: 14 mg/dL (ref 6–23)
CO2: 28 meq/L (ref 19–32)
Calcium: 9.7 mg/dL (ref 8.4–10.5)
Chloride: 103 meq/L (ref 96–112)
Creatinine, Ser: 0.82 mg/dL (ref 0.40–1.20)
GFR: 86.85 mL/min (ref >60.00)
Glucose, Bld: 97 mg/dL (ref 70–99)
Potassium: 4.4 meq/L (ref 3.5–5.1)
Sodium: 139 meq/L (ref 135–145)
Total Bilirubin: 0.4 mg/dL (ref 0.2–1.2)
Total Protein: 6.8 g/dL (ref 6.0–8.3)

## 2024-02-25 LAB — LIPID PANEL
Cholesterol: 241 mg/dL — ABNORMAL HIGH (ref 0–200)
HDL: 48.2 mg/dL (ref >39.00)
LDL Cholesterol: 158 mg/dL — ABNORMAL HIGH (ref 0–99)
NonHDL: 192.86
Total CHOL/HDL Ratio: 5
Triglycerides: 174 mg/dL — ABNORMAL HIGH (ref 0.0–149.0)
VLDL: 34.8 mg/dL (ref 0.0–40.0)

## 2024-02-25 LAB — HEMOGLOBIN A1C: Hgb A1c MFr Bld: 5.8 % (ref 4.6–6.5)

## 2024-02-25 LAB — VITAMIN B12: Vitamin B-12: 680 pg/mL (ref 211–911)

## 2024-02-25 NOTE — Progress Notes (Signed)
 No critical labs need to be addressed urgently. We will discuss labs in detail at upcoming office visit.

## 2024-03-05 ENCOUNTER — Ambulatory Visit (INDEPENDENT_AMBULATORY_CARE_PROVIDER_SITE_OTHER): Admitting: Family Medicine

## 2024-03-05 ENCOUNTER — Encounter: Payer: Self-pay | Admitting: Family Medicine

## 2024-03-05 VITALS — BP 138/96 | HR 90 | Temp 98.4°F | Ht 64.0 in | Wt 200.5 lb

## 2024-03-05 DIAGNOSIS — R7303 Prediabetes: Secondary | ICD-10-CM

## 2024-03-05 DIAGNOSIS — M722 Plantar fascial fibromatosis: Secondary | ICD-10-CM | POA: Insufficient documentation

## 2024-03-05 DIAGNOSIS — R030 Elevated blood-pressure reading, without diagnosis of hypertension: Secondary | ICD-10-CM | POA: Insufficient documentation

## 2024-03-05 DIAGNOSIS — Z0000 Encounter for general adult medical examination without abnormal findings: Secondary | ICD-10-CM | POA: Diagnosis not present

## 2024-03-05 DIAGNOSIS — E66811 Obesity, class 1: Secondary | ICD-10-CM | POA: Diagnosis not present

## 2024-03-05 DIAGNOSIS — Z6832 Body mass index (BMI) 32.0-32.9, adult: Secondary | ICD-10-CM

## 2024-03-05 NOTE — Progress Notes (Signed)
 Patient ID: Patricia Baird, female    DOB: February 09, 1981, 43 y.o.   MRN: [id number]  This visit was conducted in person.  BP (!) 138/96   Pulse 90   Temp 98.4 F (36.9 C) (Oral)   Ht 5' 4 (1.626 m)   Wt 200 lb 8 oz (90.9 kg)   SpO2 99%   BMI 34.42 kg/m    CC:  Chief Complaint  Patient presents with   Annual Exam    Pt here for CPE and lab results.    Subjective:   HPI: Patricia Baird is a 43 y.o. female presenting on 03/05/2024 for Annual Exam (Pt here for CPE and lab results.)  The patient presents for review of chronic health problems. He/She also has the following acute concerns today: None  Pt doing well overall. Reviewed labs in detail.   Diet:  heart healthy  diet, occ fast food.  Exercise: walking 10K per day 30-40 min per day, weight  She is interested in considering a GLP1 medication for weight management.  Body mass index is 34.42 kg/m.  Wt Readings from Last 3 Encounters:  03/05/24 200 lb 8 oz (90.9 kg)  04/30/23 198 lb 4 oz (89.9 kg)  02/28/23 204 lb 9.6 oz (92.8 kg)    Prediabetes  Improved with diet changes Lab Results  Component Value Date   HGBA1C 5.8 02/25/2024   Lab Results  Component Value Date   CHOL 241 (H) 02/25/2024   HDL 48.20 02/25/2024   LDLCALC 158 (H) 02/25/2024   LDLDIRECT 147.0 03/12/2017   TRIG 174.0 (H) 02/25/2024   CHOLHDL 5 02/25/2024  The 10-year ASCVD risk score (Arnett DK, et al., 2019) is: 1.5%   Values used to calculate the score:     Age: 75 years     Clincally relevant sex: Female     Is Non-Hispanic African American: No     Diabetic: No     Tobacco smoker: No     Systolic Blood Pressure: 138 mmHg     Is BP treated: No     HDL Cholesterol: 48.2 mg/dL     Total Cholesterol: 241 mg/dL   Brother with brain bleed, CVA from HTN.  Waa an  alcoholicPossible adrenal issue.    BP up today in office...  at home usually 120-80 occ diastolic 90s.  Has been more stress.  Son got married in last couple of  months so eating out, fast food. BP Readings from Last 3 Encounters:  03/05/24 (!) 138/96  04/30/23 (!) 140/80  02/28/23 134/72     Relevant past medical, surgical, family and social history reviewed and updated as indicated. Interim medical history since our last visit reviewed. Allergies and medications reviewed and updated. Outpatient Medications Prior to Visit  Medication Sig Dispense Refill   acetaminophen (TYLENOL) 500 MG tablet Take 500 mg by mouth every 6 (six) hours as needed.     aspirin 325 MG tablet Take 325 mg by mouth daily as needed.     ibuprofen (ADVIL) 200 MG tablet Take 400 mg by mouth every 6 (six) hours as needed.     Loratadine 10 MG CAPS Take 1 capsule by mouth daily.     No facility-administered medications prior to visit.     Per HPI unless specifically indicated in ROS section below Review of Systems  Constitutional:  Negative for fatigue and fever.  HENT:  Negative for congestion.   Eyes:  Negative for pain.  Respiratory:  Negative for  cough and shortness of breath.   Cardiovascular:  Negative for chest pain, palpitations and leg swelling.  Gastrointestinal:  Negative for abdominal pain.  Genitourinary:  Negative for dysuria and vaginal bleeding.  Musculoskeletal:  Negative for back pain.  Neurological:  Negative for syncope, light-headedness and headaches.  Psychiatric/Behavioral:  Negative for dysphoric mood.    Objective:  BP (!) 138/96   Pulse 90   Temp 98.4 F (36.9 C) (Oral)   Ht 5' 4 (1.626 m)   Wt 200 lb 8 oz (90.9 kg)   SpO2 99%   BMI 34.42 kg/m   Wt Readings from Last 3 Encounters:  03/05/24 200 lb 8 oz (90.9 kg)  04/30/23 198 lb 4 oz (89.9 kg)  02/28/23 204 lb 9.6 oz (92.8 kg)      Physical Exam Vitals and nursing note reviewed.  Constitutional:      General: She is not in acute distress.    Appearance: Normal appearance. She is well-developed. She is not ill-appearing or toxic-appearing.  HENT:     Head: Normocephalic.      Right Ear: Hearing, tympanic membrane, ear canal and external ear normal.     Left Ear: Hearing, tympanic membrane, ear canal and external ear normal.     Nose: Nose normal.  Eyes:     General: Lids are normal. Lids are everted, no foreign bodies appreciated.     Conjunctiva/sclera: Conjunctivae normal.     Pupils: Pupils are equal, round, and reactive to light.  Neck:     Thyroid : No thyroid  mass or thyromegaly.     Vascular: No carotid bruit.     Trachea: Trachea normal.  Cardiovascular:     Rate and Rhythm: Normal rate and regular rhythm.     Heart sounds: Normal heart sounds, S1 normal and S2 normal. No murmur heard.    No gallop.  Pulmonary:     Effort: Pulmonary effort is normal. No respiratory distress.     Breath sounds: Normal breath sounds. No wheezing, rhonchi or rales.  Abdominal:     General: Bowel sounds are normal. There is no distension or abdominal bruit.     Palpations: Abdomen is soft. There is no fluid wave or mass.     Tenderness: There is no abdominal tenderness. There is no guarding or rebound.     Hernia: No hernia is present.  Musculoskeletal:     Cervical back: Normal range of motion and neck supple.  Lymphadenopathy:     Cervical: No cervical adenopathy.  Skin:    General: Skin is warm and dry.     Findings: No rash.  Neurological:     Mental Status: She is alert.     Cranial Nerves: No cranial nerve deficit.     Sensory: No sensory deficit.  Psychiatric:        Mood and Affect: Mood is not anxious or depressed.        Speech: Speech normal.        Behavior: Behavior normal. Behavior is cooperative.        Judgment: Judgment normal.       Results for orders placed or performed in visit on 02/25/24  Vitamin B12   Collection Time: 02/25/24  9:17 AM  Result Value Ref Range   Vitamin B-12 680 211 - 911 pg/mL  CBC with Differential/Platelet   Collection Time: 02/25/24  9:17 AM  Result Value Ref Range   WBC 5.9 4.0 - 10.5 K/uL   RBC 5.30 (H)  3.87 - 5.11 Mil/uL   Hemoglobin 13.8 12.0 - 15.0 g/dL   HCT 57.8 63.9 - 53.9 %   MCV 79.5 78.0 - 100.0 fl   MCHC 32.7 30.0 - 36.0 g/dL   RDW 84.2 (H) 88.4 - 84.4 %   Platelets 299.0 150.0 - 400.0 K/uL   Neutrophils Relative % 61.0 43.0 - 77.0 %   Lymphocytes Relative 27.7 12.0 - 46.0 %   Monocytes Relative 6.9 3.0 - 12.0 %   Eosinophils Relative 3.8 0.0 - 5.0 %   Basophils Relative 0.6 0.0 - 3.0 %   Neutro Abs 3.6 1.4 - 7.7 K/uL   Lymphs Abs 1.6 0.7 - 4.0 K/uL   Monocytes Absolute 0.4 0.1 - 1.0 K/uL   Eosinophils Absolute 0.2 0.0 - 0.7 K/uL   Basophils Absolute 0.0 0.0 - 0.1 K/uL  Lipid panel   Collection Time: 02/25/24  9:17 AM  Result Value Ref Range   Cholesterol 241 (H) 0 - 200 mg/dL   Triglycerides 825.9 (H) 0.0 - 149.0 mg/dL   HDL 51.79 >60.99 mg/dL   VLDL 65.1 0.0 - 59.9 mg/dL   LDL Cholesterol 841 (H) 0 - 99 mg/dL   Total CHOL/HDL Ratio 5    NonHDL 192.86   Hemoglobin A1c   Collection Time: 02/25/24  9:17 AM  Result Value Ref Range   Hgb A1c MFr Bld 5.8 4.6 - 6.5 %  Comprehensive metabolic panel   Collection Time: 02/25/24  9:17 AM  Result Value Ref Range   Sodium 139 135 - 145 mEq/L   Potassium 4.4 3.5 - 5.1 mEq/L   Chloride 103 96 - 112 mEq/L   CO2 28 19 - 32 mEq/L   Glucose, Bld 97 70 - 99 mg/dL   BUN 14 6 - 23 mg/dL   Creatinine, Ser 9.17 0.40 - 1.20 mg/dL   Total Bilirubin 0.4 0.2 - 1.2 mg/dL   Alkaline Phosphatase 49 39 - 117 U/L   AST 16 0 - 37 U/L   ALT 11 0 - 35 U/L   Total Protein 6.8 6.0 - 8.3 g/dL   Albumin 4.5 3.5 - 5.2 g/dL   GFR 13.14 >39.99 mL/min   Calcium 9.7 8.4 - 10.5 mg/dL    This visit occurred during the SARS-CoV-2 public health emergency.  Safety protocols were in place, including screening questions prior to the visit, additional usage of staff PPE, and extensive cleaning of exam room while observing appropriate contact time as indicated for disinfecting solutions.   COVID 19 screen:  No recent travel or known exposure to  COVID19 The patient denies respiratory symptoms of COVID 19 at this time. The importance of social distancing was discussed today.   Assessment and Plan The patient's preventative maintenance and recommended screening tests for an annual wellness exam were reviewed in full today. Brought up to date unless services declined.  Counselled on the importance of diet, exercise, and its role in overall health and mortality. The patient's FH and SH was reviewed, including their home life, tobacco status, and drug and alcohol status.   Vaccines: uptodate tdap,  Refuses flu vaccine. S/P 2 COVID vaccines Pap/DVE: 07/2020 nml pap, neg HPV.SABRA Repeat in 5 years MGM ovarian cancer age 71, no clear indication for DVE. No early family history of breast cancer or colon cancer. Mammogram: 02/2023 Smoking Status: none ETOH/ drug use: none/none HIV screen:    completed. Hep c:  done No early family history of colon cancer.  Greig Ring, MD

## 2024-03-05 NOTE — Assessment & Plan Note (Signed)
 She had noted occasional blood pressure elevations at home and her blood pressure diastolic is elevated in the office today.  She will return to her regular diet, decreased eating out and salt intake.  If blood pressure is not improving over the next 2 to 4 weeks she will call for recommendations.  Check blood pressure daily, goal BP less than 140/90.

## 2024-03-05 NOTE — Assessment & Plan Note (Signed)
 Chronic, discussed lifestyle changes.  She will plans to return to nutritionist.  She will continue regular walking exercise.  Unfortunately GLP-1 medications are not covered by her insurance.  She is not a great candidate for stimulants given blood pressure borderline elevated today. Discussed healthy weight and wellness clinic and possible referral.  She will consider this.

## 2024-03-05 NOTE — Assessment & Plan Note (Signed)
 Encouraged exercise, weight loss, healthy eating habits. ? ?

## 2024-03-23 ENCOUNTER — Other Ambulatory Visit: Payer: Self-pay | Admitting: Family Medicine

## 2024-03-23 ENCOUNTER — Ambulatory Visit
Admission: RE | Admit: 2024-03-23 | Discharge: 2024-03-23 | Disposition: A | Discharge status: Home / Self Care | Source: Ambulatory Visit | Attending: Family Medicine | Admitting: Family Medicine

## 2024-03-23 DIAGNOSIS — Z1231 Encounter for screening mammogram for malignant neoplasm of breast: Secondary | ICD-10-CM

## 2024-03-26 ENCOUNTER — Ambulatory Visit: Payer: Self-pay | Admitting: Family Medicine

## 2024-04-22 IMAGING — MG MM DIGITAL SCREENING BILAT W/ TOMO AND CAD
8 series · 8 of 24 positions shown · non-contrast
Comparison: Previous exam(s).

CLINICAL DATA: Screening.

EXAM:
DIGITAL SCREENING BILATERAL MAMMOGRAM WITH TOMOSYNTHESIS AND CAD
TECHNIQUE: Bilateral screening digital craniocaudal and mediolateral oblique
mammograms were obtained. Bilateral screening digital breast
tomosynthesis was performed. The images were evaluated with
computer-aided detection.

[R MLO synth-2D]
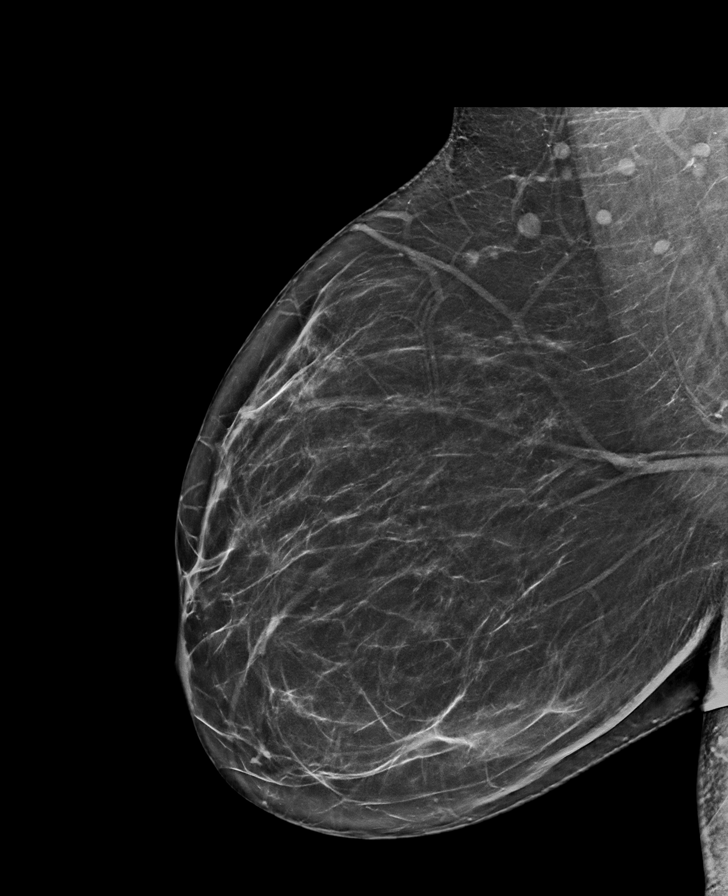

[R CC synth-2D]
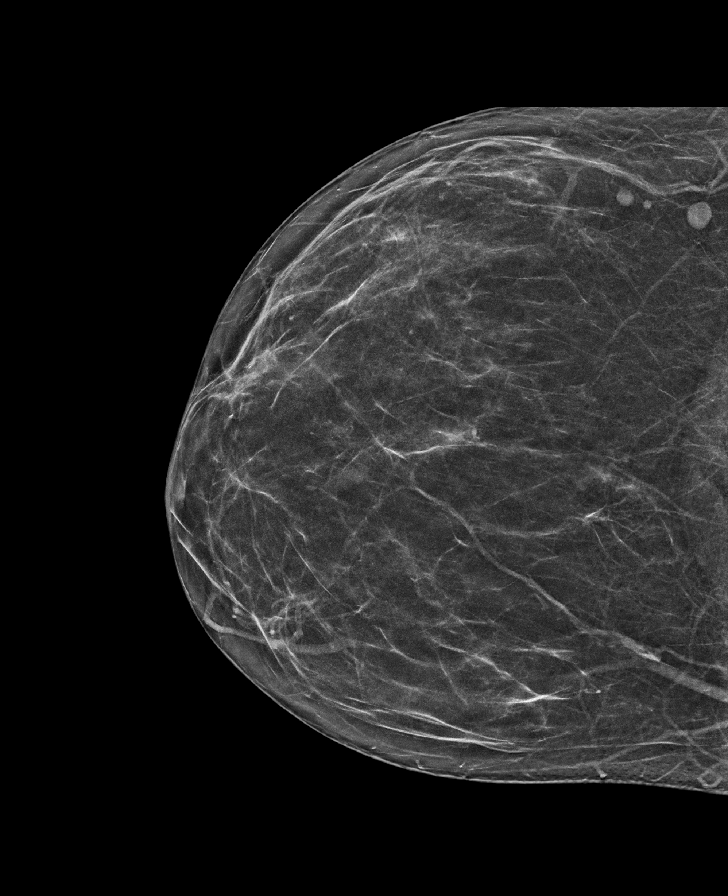

[L CC synth-2D]
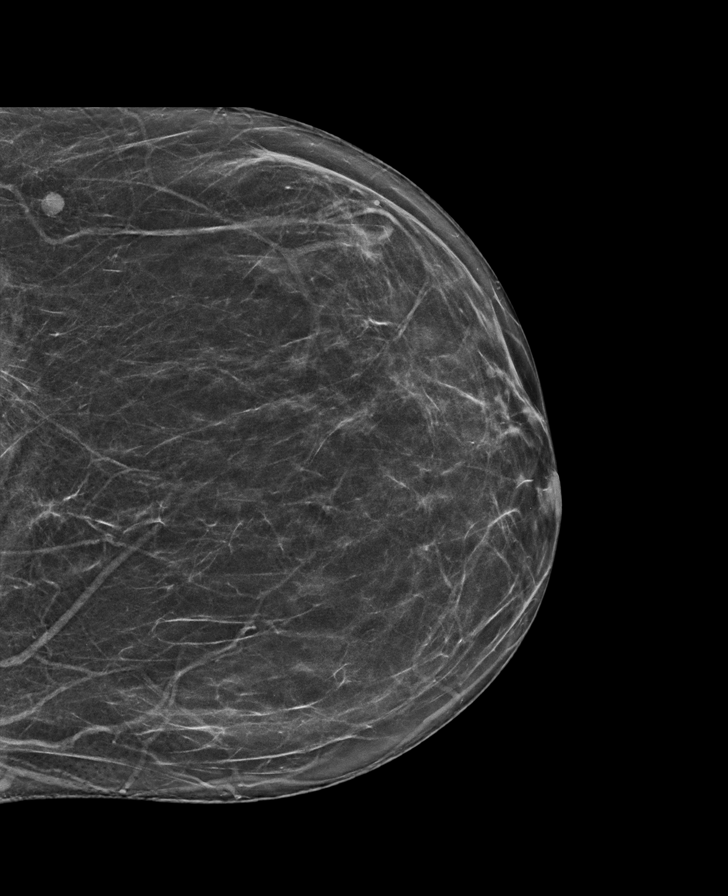

[L MLO synth-2D]
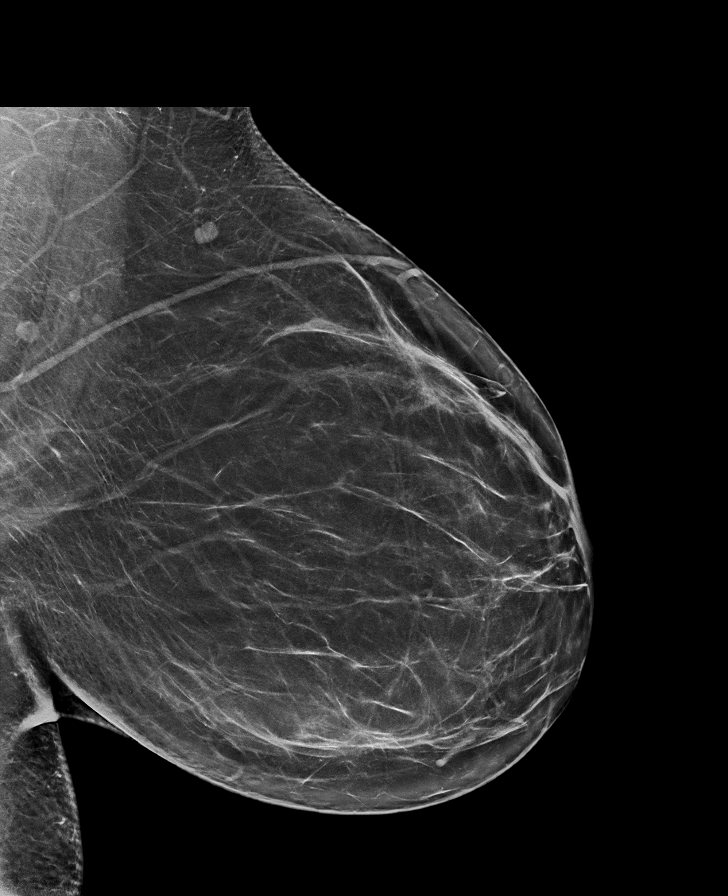

[R MLO tomo · tomo slice 39/77.0]
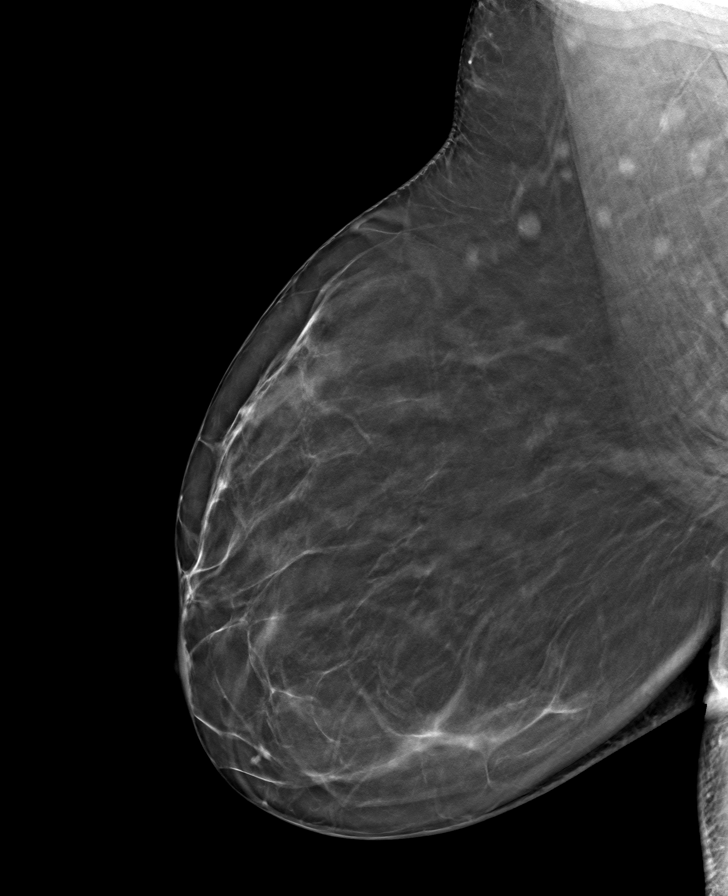

[L MLO tomo · tomo slice 40/79.0]
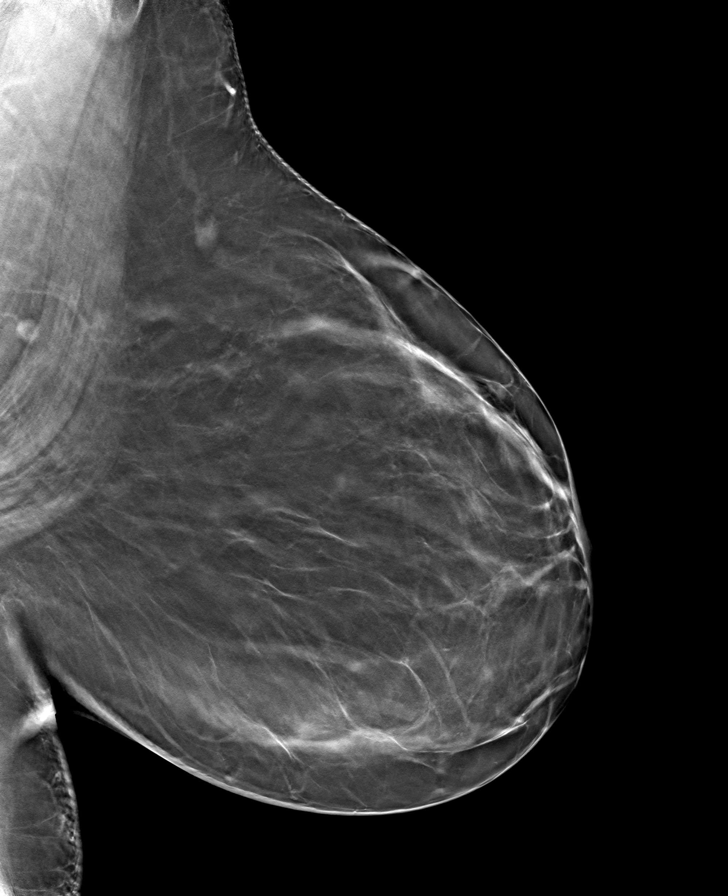

[L CC tomo · tomo slice 33/66.0]
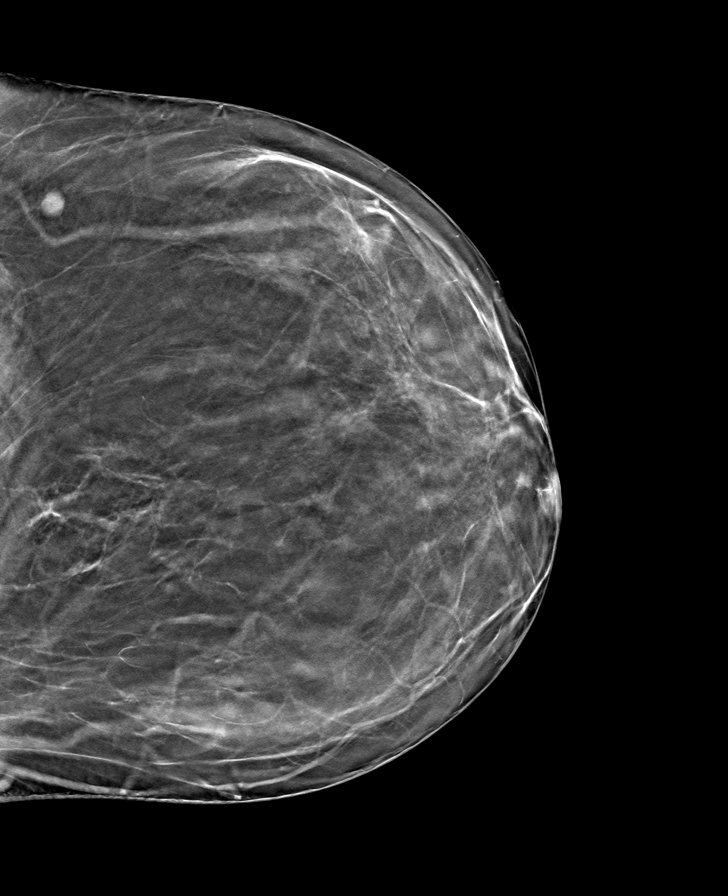

[R CC tomo · tomo slice 33/65.0]
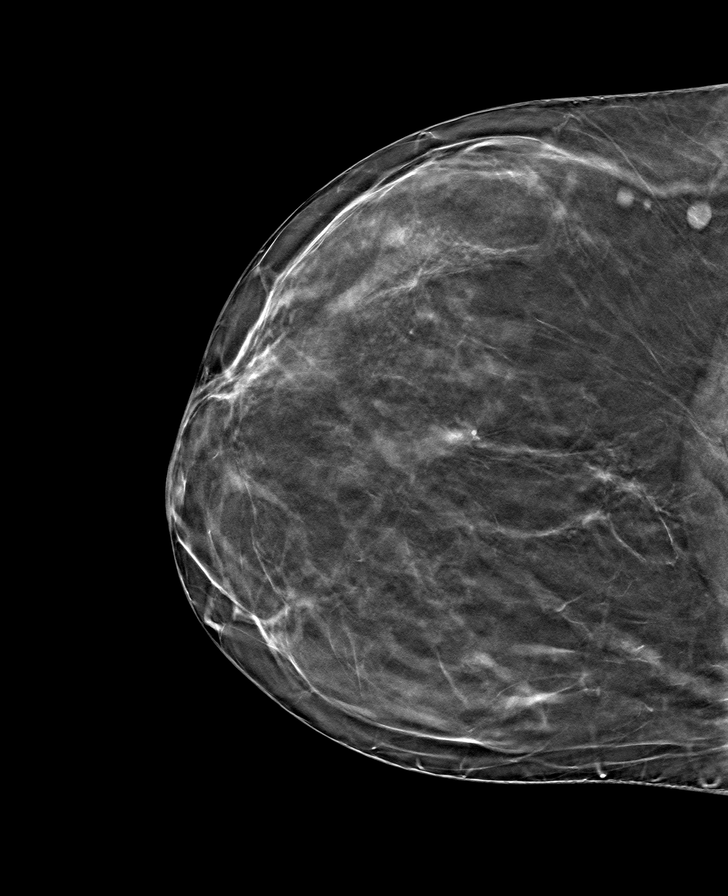

[8 of 24 positions shown; findings below may reference images not displayed]

ACR Breast Density Category b: There are scattered areas of
fibroglandular density.
FINDINGS: There are no findings suspicious for malignancy.
IMPRESSION: No mammographic evidence of malignancy. A result letter of this
screening mammogram will be mailed directly to the patient.

RECOMMENDATION:
Screening mammogram in one year. (Code:51-O-LD2)

BI-RADS CATEGORY  1: Negative.
